# Patient Record
Sex: Male | Born: 1950 | Race: White | Hispanic: No | Smoking: Former smoker
Health system: Southern US, Community
[De-identification: ages and names within clinical notes are randomized; demographics above are authoritative.]

## PROBLEM LIST (undated history)

## (undated) DIAGNOSIS — E119 Type 2 diabetes mellitus without complications: Secondary | ICD-10-CM

## (undated) DIAGNOSIS — I2510 Atherosclerotic heart disease of native coronary artery without angina pectoris: Secondary | ICD-10-CM

## (undated) DIAGNOSIS — K449 Diaphragmatic hernia without obstruction or gangrene: Secondary | ICD-10-CM

## (undated) DIAGNOSIS — K5090 Crohn's disease, unspecified, without complications: Secondary | ICD-10-CM

## (undated) DIAGNOSIS — K219 Gastro-esophageal reflux disease without esophagitis: Secondary | ICD-10-CM

## (undated) DIAGNOSIS — I10 Essential (primary) hypertension: Secondary | ICD-10-CM

## (undated) HISTORY — PX: OTHER SURGICAL HISTORY: SHX169

## (undated) HISTORY — DX: Type 2 diabetes mellitus without complications: E11.9

## (undated) HISTORY — DX: Essential (primary) hypertension: I10

## (undated) HISTORY — DX: Diaphragmatic hernia without obstruction or gangrene: K44.9

## (undated) HISTORY — DX: Gastro-esophageal reflux disease without esophagitis: K21.9

## (undated) HISTORY — PX: CHOLECYSTECTOMY: SHX55

## (undated) HISTORY — DX: Crohn's disease, unspecified, without complications: K50.90

## (undated) HISTORY — DX: Atherosclerotic heart disease of native coronary artery without angina pectoris: I25.10

## (undated) HISTORY — PX: CARPAL TUNNEL RELEASE: SHX101

---

## 1997-11-24 ENCOUNTER — Ambulatory Visit (HOSPITAL_COMMUNITY): Admission: RE | Admit: 1997-11-24 | Discharge: 1997-11-24 | Payer: Self-pay | Admitting: Gastroenterology

## 1997-12-12 ENCOUNTER — Encounter: Admission: RE | Admit: 1997-12-12 | Discharge: 1998-03-12 | Payer: Self-pay | Admitting: Family Medicine

## 1998-01-02 ENCOUNTER — Ambulatory Visit (HOSPITAL_COMMUNITY): Admission: RE | Admit: 1998-01-02 | Discharge: 1998-01-02 | Payer: Self-pay | Admitting: Gastroenterology

## 1999-04-25 ENCOUNTER — Ambulatory Visit (HOSPITAL_COMMUNITY): Admission: RE | Admit: 1999-04-25 | Discharge: 1999-04-25 | Payer: Self-pay | Admitting: Gastroenterology

## 1999-04-25 ENCOUNTER — Encounter (INDEPENDENT_AMBULATORY_CARE_PROVIDER_SITE_OTHER): Payer: Self-pay | Admitting: Specialist

## 1999-05-25 ENCOUNTER — Ambulatory Visit (HOSPITAL_BASED_OUTPATIENT_CLINIC_OR_DEPARTMENT_OTHER): Admission: RE | Admit: 1999-05-25 | Discharge: 1999-05-25 | Payer: Self-pay | Admitting: General Surgery

## 1999-06-13 ENCOUNTER — Encounter: Admission: RE | Admit: 1999-06-13 | Discharge: 1999-06-13 | Payer: Self-pay | Admitting: Gastroenterology

## 1999-06-13 ENCOUNTER — Encounter: Payer: Self-pay | Admitting: Gastroenterology

## 2002-05-11 ENCOUNTER — Ambulatory Visit (HOSPITAL_COMMUNITY): Admission: RE | Admit: 2002-05-11 | Discharge: 2002-05-11 | Payer: Self-pay | Admitting: Gastroenterology

## 2002-05-11 ENCOUNTER — Encounter (INDEPENDENT_AMBULATORY_CARE_PROVIDER_SITE_OTHER): Payer: Self-pay | Admitting: Specialist

## 2004-05-30 ENCOUNTER — Encounter: Admission: RE | Admit: 2004-05-30 | Discharge: 2004-05-30 | Payer: Self-pay | Admitting: General Surgery

## 2004-06-29 ENCOUNTER — Encounter: Admission: RE | Admit: 2004-06-29 | Discharge: 2004-06-29 | Payer: Self-pay | Admitting: Gastroenterology

## 2004-07-24 ENCOUNTER — Ambulatory Visit (HOSPITAL_COMMUNITY): Admission: RE | Admit: 2004-07-24 | Discharge: 2004-07-24 | Payer: Self-pay | Admitting: Gastroenterology

## 2004-08-23 ENCOUNTER — Ambulatory Visit (HOSPITAL_COMMUNITY): Admission: RE | Admit: 2004-08-23 | Discharge: 2004-08-23 | Payer: Self-pay | Admitting: Gastroenterology

## 2004-08-23 ENCOUNTER — Encounter (INDEPENDENT_AMBULATORY_CARE_PROVIDER_SITE_OTHER): Payer: Self-pay | Admitting: Specialist

## 2007-10-09 ENCOUNTER — Encounter: Admission: RE | Admit: 2007-10-09 | Discharge: 2007-10-09 | Payer: Self-pay | Admitting: Gastroenterology

## 2008-01-19 ENCOUNTER — Encounter (INDEPENDENT_AMBULATORY_CARE_PROVIDER_SITE_OTHER): Payer: Self-pay | Admitting: Gastroenterology

## 2008-01-19 ENCOUNTER — Ambulatory Visit (HOSPITAL_COMMUNITY): Admission: RE | Admit: 2008-01-19 | Discharge: 2008-01-19 | Payer: Self-pay | Admitting: Gastroenterology

## 2008-01-28 ENCOUNTER — Ambulatory Visit: Payer: Self-pay | Admitting: Cardiovascular Disease

## 2008-02-05 ENCOUNTER — Ambulatory Visit: Payer: Self-pay | Admitting: Cardiovascular Disease

## 2008-02-05 ENCOUNTER — Inpatient Hospital Stay (HOSPITAL_COMMUNITY): Admission: RE | Admit: 2008-02-05 | Discharge: 2008-02-06 | Payer: Self-pay | Admitting: Cardiovascular Disease

## 2008-02-05 ENCOUNTER — Ambulatory Visit: Payer: Self-pay

## 2008-02-05 ENCOUNTER — Encounter: Payer: Self-pay | Admitting: Cardiovascular Disease

## 2008-03-08 ENCOUNTER — Ambulatory Visit: Payer: Self-pay | Admitting: Cardiovascular Disease

## 2008-03-11 ENCOUNTER — Ambulatory Visit (HOSPITAL_BASED_OUTPATIENT_CLINIC_OR_DEPARTMENT_OTHER): Admission: RE | Admit: 2008-03-11 | Discharge: 2008-03-11 | Payer: Self-pay | Admitting: Orthopedic Surgery

## 2008-03-23 ENCOUNTER — Ambulatory Visit: Payer: Self-pay

## 2008-03-23 ENCOUNTER — Encounter: Payer: Self-pay | Admitting: Cardiovascular Disease

## 2008-06-09 ENCOUNTER — Ambulatory Visit: Payer: Self-pay | Admitting: Cardiology

## 2008-06-09 ENCOUNTER — Encounter: Payer: Self-pay | Admitting: Cardiology

## 2008-08-22 ENCOUNTER — Ambulatory Visit: Payer: Self-pay | Admitting: Cardiovascular Disease

## 2008-08-22 ENCOUNTER — Encounter: Payer: Self-pay | Admitting: Cardiovascular Disease

## 2008-08-22 DIAGNOSIS — I25110 Atherosclerotic heart disease of native coronary artery with unstable angina pectoris: Secondary | ICD-10-CM | POA: Insufficient documentation

## 2008-08-22 DIAGNOSIS — I10 Essential (primary) hypertension: Secondary | ICD-10-CM | POA: Insufficient documentation

## 2008-08-22 DIAGNOSIS — E785 Hyperlipidemia, unspecified: Secondary | ICD-10-CM | POA: Insufficient documentation

## 2008-08-22 DIAGNOSIS — I2510 Atherosclerotic heart disease of native coronary artery without angina pectoris: Secondary | ICD-10-CM | POA: Insufficient documentation

## 2008-08-22 DIAGNOSIS — E1169 Type 2 diabetes mellitus with other specified complication: Secondary | ICD-10-CM | POA: Insufficient documentation

## 2009-03-02 ENCOUNTER — Ambulatory Visit: Payer: Self-pay | Admitting: Cardiovascular Disease

## 2009-03-09 ENCOUNTER — Telehealth: Payer: Self-pay | Admitting: Cardiovascular Disease

## 2009-05-25 ENCOUNTER — Ambulatory Visit: Payer: Self-pay | Admitting: Cardiovascular Disease

## 2009-06-01 ENCOUNTER — Encounter: Payer: Self-pay | Admitting: Cardiovascular Disease

## 2009-07-25 ENCOUNTER — Encounter: Payer: Self-pay | Admitting: Cardiovascular Disease

## 2009-07-25 ENCOUNTER — Ambulatory Visit: Payer: Self-pay

## 2009-07-25 ENCOUNTER — Ambulatory Visit (HOSPITAL_COMMUNITY): Admission: RE | Admit: 2009-07-25 | Discharge: 2009-07-25 | Payer: Self-pay | Admitting: Cardiovascular Disease

## 2009-07-25 ENCOUNTER — Ambulatory Visit: Payer: Self-pay | Admitting: Cardiovascular Disease

## 2009-07-31 ENCOUNTER — Ambulatory Visit: Payer: Self-pay | Admitting: Cardiovascular Disease

## 2009-07-31 ENCOUNTER — Telehealth: Payer: Self-pay | Admitting: Cardiovascular Disease

## 2009-08-01 ENCOUNTER — Ambulatory Visit: Payer: Self-pay | Admitting: Cardiovascular Disease

## 2009-08-03 ENCOUNTER — Ambulatory Visit: Payer: Self-pay | Admitting: Cardiovascular Disease

## 2009-08-03 ENCOUNTER — Inpatient Hospital Stay (HOSPITAL_COMMUNITY): Admission: RE | Admit: 2009-08-03 | Discharge: 2009-08-04 | Payer: Self-pay | Admitting: Cardiovascular Disease

## 2009-08-03 LAB — CONVERTED CEMR LAB
BUN: 10 mg/dL (ref 6–23)
Basophils Relative: 0.7 % (ref 0.0–3.0)
Chloride: 109 meq/L (ref 96–112)
Eosinophils Relative: 3.5 % (ref 0.0–5.0)
GFR calc non Af Amer: 91.24 mL/min (ref >60)
HCT: 45.9 % (ref 39.0–52.0)
Hemoglobin: 15 g/dL (ref 13.0–17.0)
Lymphs Abs: 2.1 10*3/uL (ref 0.7–4.0)
MCV: 90 fL (ref 78.0–100.0)
Monocytes Absolute: 0.5 10*3/uL (ref 0.1–1.0)
Monocytes Relative: 8.6 % (ref 3.0–12.0)
Neutro Abs: 3.3 10*3/uL (ref 1.4–7.7)
Potassium: 4.6 meq/L (ref 3.5–5.1)
RBC: 5.1 M/uL (ref 4.22–5.81)
Sodium: 144 meq/L (ref 135–145)
WBC: 6.1 10*3/uL (ref 4.5–10.5)
aPTT: 26.1 s (ref 21.7–28.8)

## 2009-08-09 ENCOUNTER — Telehealth: Payer: Self-pay | Admitting: Cardiovascular Disease

## 2009-08-14 ENCOUNTER — Emergency Department (HOSPITAL_COMMUNITY): Admission: EM | Admit: 2009-08-14 | Discharge: 2009-08-14 | Payer: Self-pay | Admitting: Emergency Medicine

## 2009-09-11 ENCOUNTER — Ambulatory Visit: Payer: Self-pay | Admitting: Cardiovascular Disease

## 2009-09-11 DIAGNOSIS — R5381 Other malaise: Secondary | ICD-10-CM | POA: Insufficient documentation

## 2009-09-11 DIAGNOSIS — R5383 Other fatigue: Secondary | ICD-10-CM

## 2009-09-12 LAB — CONVERTED CEMR LAB: TSH: 0.89 microintl units/mL (ref 0.35–5.50)

## 2009-11-23 ENCOUNTER — Ambulatory Visit: Payer: Self-pay | Admitting: Cardiovascular Disease

## 2010-01-03 ENCOUNTER — Telehealth: Payer: Self-pay | Admitting: Cardiovascular Disease

## 2010-05-29 ENCOUNTER — Ambulatory Visit: Payer: Self-pay | Admitting: Cardiovascular Disease

## 2010-07-14 ENCOUNTER — Encounter: Payer: Self-pay | Admitting: Gastroenterology

## 2010-07-24 NOTE — Progress Notes (Signed)
Summary: cath  Phone Note Call from Patient Call back at Home Phone [phone]   Caller: Patient Reason for Call: Talk to Nurse Action Taken: Appt Scheduled Summary of Call: calling back to set up cath Initial call taken by: Migdalia Dk,  July 31, 2009 2:39 PM  Follow-up for Phone Call        Spoke with pt. He would like to have catheterization done AM 2/10. Dossie Arbour, RN, BSN  July 31, 2009 2:52 PM Cath set up for 7:30 AM on 08/03/09 with pt to arrive at 6 AM at short stay.  Pt in agreement with these times.  He will come to office on 08/01/09 for pre cath labs and will pick up cath instructions at that time.  I verbally gave pre cath instructions to pt who verbalized understanding. Follow-up by: Dossie Arbour, RN, BSN,  July 31, 2009 3:11 PM   New Allergies: ! GLUCOPHAGE ! DOXYCYCLINE ! * DICYCLOMINE ! * CHLORTHALIDONE New Allergies: ! GLUCOPHAGE ! DOXYCYCLINE ! * DICYCLOMINE ! * CHLORTHALIDONE

## 2010-07-24 NOTE — Assessment & Plan Note (Signed)
Summary: John Cuevas   Visit Type:  6 mo f/u Primary Provider:  Herb Grays  CC:  pt states our scale weighed him wrong says he only 205 and though he does have on heavy boots today...denies any complaints today.  History of Present Illness: 60 yo WM with h/o HTN, Hyperlipidemia, DM and CAD diagnosed by cath August 2009 here for follow up. Pt had DES placed in proximal RCA August 2009 and  drug eluting stents in the LAD and the OM in February 2011. He returns today for follow up. He has been doing well. His only complaint is easy bruising on the Effient. No chest pain or SOB. He has been active. Muscle aches better off of statin.     Current Medications (verified): 1)  Toprol Xl 50 Mg Xr24h-Tab (Metoprolol Succinate) .... One By Mouth Per Day 2)  Aspirin 81 Mg Tbec (Aspirin) .... Take One Tablet By Mouth Daily 3)  Nexium 40 Mg Cpdr (Esomeprazole Magnesium) .... Daily 4)  Asacol 400 Mg Tbec (Mesalamine) .... Pt Varies Dose Per Daily 5)  Nitroglycerin 0.4 Mg Subl (Nitroglycerin) .... One Tablet Under Tongue Every 5 Minutes As Needed For Chest Pain---May Repeat Times Three 6)  Effient 10 Mg Tabs (Prasugrel Hcl) .Marland Kitchen.. 1 Tab Once Daily 7)  Vsl#3  Caps (Probiotic Product) .... As Directed 8)  Xyzal 5 Mg Tabs (Levocetirizine Dihydrochloride) .... Take 1 By Mouth At Bedtime 9)  Toprol Xl 25 Mg Xr24h-Tab (Metoprolol Succinate) .... Take One Tablet By Mouth  As Needed At Bedtime For Elevated Blood Pressure  Allergies: 1)  ! Plavix (Clopidogrel Bisulfate) 2)  ! Niacin 3)  ! Glucophage 4)  ! Doxycycline 5)  ! * Dicyclomine 6)  ! * Chlorthalidone  Past History:  Past Medical History: Reviewed history from 09/11/2009 and no changes required. Diabetes Type 2 Hypertension CAD s/p drug eluting stent RCA 8/09, drug eluting stent LAD/OM 2/11.  Hiatal hernia Crohns disease GERD  Social History: Reviewed history from 05/25/2009 and no changes required. 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    Vital Signs:  Patient profile:   61 year old male Height:      67 inches Weight:      214.25 pounds BMI:     33.68 Pulse rate:   78 / minute Pulse rhythm:   regular BP sitting:   130 / 90  (left arm) Cuff size:   large  Vitals Entered By: Danielle Rankin, CMA (May 29, 2010 9:17 AM)  Physical Exam  General:  General: Well developed, well nourished, NAD Musculoskeletal: Muscle strength 5/5 all ext Psychiatric: Mood and affect normal Neck: No JVD, no carotid bruits, no thyromegaly, no lymphadenopathy. Lungs:Clear bilaterally, no wheezes, rhonci, crackles CV: RRR no murmurs, gallops rubs Abdomen: soft, NT, ND, BS present Extremities: No edema, pulses 2+.    Impression & Recommendations:  Problem # 1:  CAD, NATIVE VESSEL (ICD-414.01)  Stable. Will need to stay on ASA and Effient until mid February. He will stop his  Effient in February. He is ready to stop the Effient because of bruising. Continue Toprol. He is not on a statin because of statin induced myalgias. He does not wish to restart a statin. His cholesterol is followed in primary care.   His updated medication list for this problem includes:    Toprol Xl 50 Mg Xr24h-tab (Metoprolol succinate) ..... One by mouth per day    Aspirin 81 Mg Tbec (Aspirin) .Marland Kitchen... Take one tablet by mouth daily    Nitroglycerin 0.4 Mg Subl  (Nitroglycerin) ..... One tablet under tongue every 5 minutes as needed for chest pain---may repeat times three    Effient 10 Mg Tabs (Prasugrel hcl) .Marland Kitchen... 1 tab once daily    Toprol Xl 25 Mg Xr24h-tab (Metoprolol succinate) .Marland Kitchen... Take one tablet by mouth  as needed at bedtime for elevated blood pressure  Orders: EKG w/ Interpretation (93000)  Problem # 2:  HYPERTENSION, BENIGN (ICD-401.1) Stable. No changes.   His updated medication list for this problem includes:    Toprol Xl 50 Mg Xr24h-tab (Metoprolol succinate) ..... One by mouth per day    Aspirin 81 Mg Tbec (Aspirin) .Marland Kitchen... Take one tablet by mouth daily    Toprol Xl 25 Mg Xr24h-tab (Metoprolol succinate) .Marland Kitchen... Take one tablet by mouth  as needed at bedtime for elevated blood pressure  Patient Instructions: 1)  Your physician recommends that you schedule a follow-up appointment in: 6 months. 2)  Your physician recommends that you continue on your current medications as directed. Please refer to the Current Medication list given to you today.

## 2010-07-24 NOTE — Letter (Signed)
Summary: Leaving Cardiology Clinic Against Medical Advice Form  Leaving Cardiology Clinic Against Medical Advice Form   Imported By: Roderic Ovens 07/04/2009 11:18:39  _____________________________________________________________________  External Attachment:    Type:   Image     Comment:   External Document

## 2010-07-24 NOTE — Cardiovascular Report (Signed)
Summary: Pre Cath Orders  Pre Cath Orders   Imported By: Roderic Ovens 08/04/2009 15:27:24  _____________________________________________________________________  External Attachment:    Type:   Image     Comment:   External Document

## 2010-07-24 NOTE — Assessment & Plan Note (Signed)
Summary: f/u abnormal stress echo/lg   Visit Type:  abnormal stress test Primary Provider:  Herb Grays  CC:  pt states chest gets tight when his hernia acts up....  History of Present Illness: 60 yo WM with h/o HTN, Hyperlipidemia, DM and CAD diagnosed by cath August 2009 here for follow up. Pt had DES placed in proximal RCA August 2009. He had done well from a cardiac standpoint for a year but at last visit, he c/o substernal chest burning. We ordered a stress echo which showed septal and inferoapical ischemia. He returns today for follow up. His rash has been treated and is better. His back pain and leg pain is unchanged. His diarrhea is improved. He has been feeling tired lately. He continues to have some substernal burning with exertion.     Current Medications (verified): 1)  Toprol Xl 50 Mg Xr24h-Tab (Metoprolol Succinate) .... One By Mouth Per Day 2)  Aspirin 81 Mg Tbec (Aspirin) .... Take One Tablet By Mouth Daily 3)  Nexium 40 Mg Cpdr (Esomeprazole Magnesium) .... Daily 4)  Xyzal 5 Mg Tabs (Levocetirizine Dihydrochloride) .... As Needed 5)  Asacol 400 Mg Tbec (Mesalamine) .... Three Tabs By Mouth Twice Daily 6)  Nitroglycerin 0.4 Mg Subl (Nitroglycerin) .... One Tablet Under Tongue Every 5 Minutes As Needed For Chest Pain---May Repeat Times Three 7)  Lipitor 40 Mg Tabs (Atorvastatin Calcium) .Marland Kitchen.. 1 Tab At Bedtime  Allergies: 1)  ! Plavix (Clopidogrel Bisulfate) 2)  ! Niacin  Past History:  Past Medical History: Reviewed history from 03/02/2009 and no changes required. Diabetes Type 2 Hypertension CAD s/p drug eluting stent RCA 8/09 Hiatal hernia Crohns disease GERD  Past Surgical History: Reviewed history from 08/22/2008 and no changes required. Cholecystectomy Laporoscopic knee surgery Carpal tunnel release  Family History: Reviewed history from 08/22/2008 and no changes required. Mother-alive, healthy Father-alive, healthy No premature CAD or sudden cardiac  death  Social History: Reviewed history from 05/25/2009 and no changes required. 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  The patient denies malaise, fever, weight gain/loss, vision loss, decreased hearing, hoarseness, palpitations, shortness of breath, prolonged cough, wheezing, sleep apnea, coughing up blood, abdominal pain, blood in stool, nausea, vomiting, diarrhea, heartburn, incontinence, blood in urine, muscle weakness, joint pain, leg swelling, rash, skin lesions, headache, fainting, dizziness, depression, anxiety, enlarged lymph nodes, easy bruising or bleeding, and environmental allergies.    Vital Signs:  Patient profile:   60 year old male Height:      67 inches Weight:      212 pounds BMI:     33.32 Pulse rate:   71 / minute Pulse rhythm:   regular BP sitting:   147 / 91  (left arm) Cuff size:   large  Vitals Entered By: Danielle Rankin, CMA (July 31, 2009 11:30 AM)  Physical Exam  General:  General: Well developed, well nourished, NAD HEENT: OP clear, mucus membranes moist SKIN: warm, dry Neuro: No focal deficits Musculoskeletal: Muscle strength 5/5 all ext Psychiatric: Mood and affect normal Neck: No JVD, no carotid bruits, no thyromegaly, no lymphadenopathy. Lungs:Clear bilaterally, no wheezes, rhonci, crackles CV: RRR no murmurs, gallops rubs Abdomen: soft, NT, ND, BS present Extremities: No edema, pulses 2+.    Stress Echocardiogram  Procedure date:  07/25/2009  Findings:      -  Stress: With stress there was 1.20mm ST segment depression in the   lateral leads that persisted into recovery. This was associated   with chest pain - Staged echo: At rest the septum and apical inferior wall appeared   hypokinetic. With stress there was worsening of the septal   hypokinesis with recovery of  the inferoapical hypokinisis. Chest   pain, ECG changes and RWMA's all consistant with significant CAD  Impression & Recommendations:  Problem # 1:  CAD, NATIVE VESSEL (ICD-414.01) Recent return of chest pain with exertion. Stress echo abnormal. Will plan left heart cath in MAIN cath lab as pt likely to need PCI. I have given him a  list of dates that I am in the cath lab. He will call  after he finds a day that his brother can come down and drive him in. Plan BMET, CBC and coags before cath.   His updated medication list for this problem includes:    Toprol Xl 50 Mg Xr24h-tab (Metoprolol succinate) ..... One by mouth per day    Aspirin 81 Mg Tbec (Aspirin) .Marland Kitchen... Take one tablet by mouth daily    Nitroglycerin 0.4 Mg Subl (Nitroglycerin) ..... One tablet under tongue every 5 minutes as needed for chest pain---may repeat times three  Problem # 2:  HYPERTENSION, BENIGN (ICD-401.1) Continue current meds. If elevated at next vist, will adjust meds.   His updated medication list for this problem includes:    Toprol Xl 50 Mg Xr24h-tab (Metoprolol succinate) ..... One by mouth per day    Aspirin 81 Mg Tbec (Aspirin) .Marland Kitchen... Take one tablet by mouth daily  Patient Instructions: 1)  Your physician recommends that you schedule a follow-up appointment in: 4-6 weeks. 2)  Please call us when you are ready to schedule catheterization. You will need lab work done in office a few days prior to catheterization. Prescriptions: LIPITOR 40 MG TABS (ATORVASTATIN CALCIUM) 1 tab at bedtime  #90 x 3   Entered by:   Danielle Rankin, CMA   Authorized by:   Verne Carrow, MD   Signed by:   Danielle Rankin, CMA on 07/31/2009   Method used:   Electronically to        [facility]* (mail-order)             ,          Ph: [phone]       Fax: [phone]   RxID:   [id number]

## 2010-07-24 NOTE — Progress Notes (Signed)
Summary: QUESTION ABOUT A MEDICATION  Phone Note Call from Patient Call back at Home Phone [phone]   Caller: Patient Summary of Call: PT CALLING REGARDING  BLOOD THINNER,HAVE QUESTIONS ABOUT THE MEDICATION Initial call taken by: Judie Grieve,  August 09, 2009 3:14 PM  Follow-up for Phone Call        Spoke with pt. Patient is  post  drug eluting stent placement done 08/03/09 Pt. states The Prasugrel 10 mg by mouth daily prescrived on D/C from Hospital. Pt. states this medication is making to have upset stomach, fever blister on lips also is making him feel tired. Pt. states last night called talked with a PA in our service pt. to stop medication. She will call him back today. Dr. Clifton James notified. Recommended to call pt. and tell him to continue medication. MD will call him back later. Pt. was called to let him know. Pt. States" I will take the medication , but it will need to be change to something else later". Ollen Gross, RN, BSN  August 09, 2009 3:48 PM    Additional Follow-up for Phone Call Additional follow up Details #1::        I spoke to Mr. Kruk. He has been taking his Effient but over the last few days describes malaise, fever blisters and GI upset. This sounds like the viral illness that has been in our community. I have asked him to keep taking all medications and call us in two days if symtpoms persist. We could start Plavix if he has continued problems. I told him that he absolutely needs dual antiplatelet therapy for at least one year.  Additional Follow-up by: Verne Carrow, MD,  August 09, 2009 4:08 PM

## 2010-07-24 NOTE — Assessment & Plan Note (Signed)
Summary: 6 month follow up/414.01/pla   Visit Type:  Follow-up Primary Provider:  Herb Grays  CC:  3 month ROV; C/O bilateral arm & leg pain since being on Effient- wants to change RX; (Stopped Lipitor b/c he doesn't have ^ lipids & d/t stomach irritation).  History of Present Illness: 60 yo WM with h/o HTN, Hyperlipidemia, DM and CAD diagnosed by cath August 2009 here for follow up. Pt had DES placed in proximal RCA August 2009. He had done well from a cardiac standpoint for a year but at last visit, we arranged a cath based on his  substernal chest burning and a stress echo which showed septal and inferoapical ischemia. A drug eluting stent was placed in the LAD and the OM. He returns today for follow up. He has been doing well. He has had no chest pain since his PCI. He continues to have occasional fatigue. He always blames his anti-platelet therapy for his fatigue. He had issues with Plavix in the past so we have been using Effient since his most recent stent placement. He has been taking all of his medications. He has a multitude of complaints today. He is having back pain, bilateral arm pain, leg pain, face pain, face numbness, upset stomach. He does have recurrence of lesions on his arms and legs which he had in the past. He feels that bugs are crawling on his ears and face. He stopped taking his Lipitor because it upset his stomach. He is convinced that the cardiac meds are causing his GI issues and his rash. He is going to see a Dermatologist tomorrow to evaluate his skin lesions.   He fortunately does not have chest pain. He has no SOB. He tells me that he feels great overall and can run a mile if he wants to. It has been difficult to convince him to take medications in the past. He often feels that any new medications cause multi system problems.    Current Medications (verified): 1)  Toprol Xl 50 Mg Xr24h-Tab (Metoprolol Succinate) .... One By Mouth Per Day, Take 1 in The Evening As  Needed 2)  Aspirin Ec 325 Mg Tbec (Aspirin) .... Take One Tablet By Mouth Daily 3)  Nexium 40 Mg Cpdr (Esomeprazole Magnesium) .... Daily 4)  Asacol 400 Mg Tbec (Mesalamine) .... Three Tabs By Mouth Twice Daily 5)  Nitroglycerin 0.4 Mg Subl (Nitroglycerin) .... One Tablet Under Tongue Every 5 Minutes As Needed For Chest Pain---May Repeat Times Three 6)  Effient 10 Mg Tabs (Prasugrel Hcl) .Marland Kitchen.. 1 Tab Once Daily 7)  Vsl#3  Caps (Probiotic Product) .... As Directed 8)  Xyzal 5 Mg Tabs (Levocetirizine Dihydrochloride) .... Take 1 By Mouth At Bedtime  Allergies: 1)  ! Plavix (Clopidogrel Bisulfate) 2)  ! Niacin 3)  ! Glucophage 4)  ! Doxycycline 5)  ! * Dicyclomine 6)  ! * Chlorthalidone  Past History:  Past Medical History: Reviewed history from 09/11/2009 and no changes required. Diabetes Type 2 Hypertension CAD s/p drug eluting stent RCA 8/09, drug eluting stent LAD/OM 2/11.  Hiatal hernia Crohns disease GERD  Social History: Reviewed history from 05/25/2009 and no changes required. 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  The patient denies fatigue, malaise, fever, weight gain/loss, vision loss,  decreased hearing, hoarseness, chest pain, palpitations, shortness of breath, prolonged cough, wheezing, sleep apnea, coughing up blood, abdominal pain, blood in stool, nausea, vomiting, diarrhea, incontinence, blood in urine, leg swelling, rash, headache, fainting, dizziness, depression, anxiety, enlarged lymph nodes, and environmental allergies.         See HPI  Vital Signs:  Patient profile:   60 year old male Height:      67 inches Weight:      211 pounds BMI:     33.17 Pulse rate:   76 / minute BP sitting:   140 / 78  (left arm) Cuff size:    regular  Vitals Entered By: Stanton Kidney, EMT-P (November 23, 2009 3:30 PM)  Physical Exam  General:  General: Well developed, well nourished, NAD HEENT: OP clear, mucus membranes moist SKIN: non blanching, circular lesions 0.5 to 1.0 cm over arms and legs. No petechial rash.  Neuro: No focal deficits Musculoskeletal: Muscle strength 5/5 all ext Psychiatric: Mood and affect normal Lungs:Clear bilaterally, no wheezes, rhonci, crackles CV: RRR no murmurs, gallops rubs Abdomen: soft, NT, ND, BS present Extremities: No edema, pulses 2+.    EKG  Procedure date:  11/23/2009  Findings:      NSR, rate 76 bpm. Normal EKG  Impression & Recommendations:  Problem # 1:  CAD, NATIVE VESSEL (ICD-414.01) Stable. He refuses to take Lipitor at this time. I am not certain that any of his muscle aches are statin related as he has been off of this medication for 2.5 months but continues to have muscles aches in arms and legs. I would like to encourage him to take a statin in the future for the beneficial cardiac effects, regardless of his cholesterol levels. I would also like to continue dual antiplatelet therapy with ASA and Effient for a total of one year (at least until February 2012). I will reduce his dose of ASA to 81 mg (enteric coated) as he continues to have GI upset. He is having fasting labs in the am at Dr. Alda Berthold office. I will ask them to add a CK level to the blood draw.  Continue beta blocker. He is asking for a separate 25 mg prescription to take at night as needed for SBP over 140. We will try to arrange this. I have spent over 30 miniutes today in medication counseling. He is very difficult to convince of the importance of medications.    His updated medication list for this problem includes:    Toprol Xl 50 Mg Xr24h-tab (Metoprolol succinate) ..... One by mouth per day    Aspirin 81 Mg Tbec (Aspirin) .Marland Kitchen... Take one tablet by mouth daily    Nitroglycerin 0.4 Mg Subl (Nitroglycerin) .....  One tablet under tongue every 5 minutes as needed for chest pain---may repeat times three    Effient 10 Mg Tabs (Prasugrel hcl) .Marland Kitchen... 1 tab once daily    Toprol Xl 25 Mg Xr24h-tab (Metoprolol succinate) .Marland Kitchen... Take one tablet by mouth  as needed at bedtime for elevated blood pressure  Orders: EKG w/ Interpretation (93000)  Problem # 2:  HYPERLIPIDEMIA-MIXED (ICD-272.4) See above. Fasting labs in am in primary care.   The following medications were removed from the medication list:    Lipitor 40 Mg Tabs (Atorvastatin calcium) .Marland Kitchen... 1 tab at bedtime  Problem # 3:  HYPERTENSION, BENIGN (ICD-401.1) Well controlled on current therapy. No changes.   His updated medication list for this problem includes:    Toprol Xl 50 Mg  Xr24h-tab (Metoprolol succinate) ..... One by mouth per day    Aspirin 81 Mg Tbec (Aspirin) .Marland Kitchen... Take one tablet by mouth daily    Toprol Xl 25 Mg Xr24h-tab (Metoprolol succinate) .Marland Kitchen... Take one tablet by mouth  as needed at bedtime for elevated blood pressure  Patient Instructions: 1)  Your physician recommends that you schedule a follow-up appointment in: 6 months 2)  Your physician has recommended you make the following change in your medication: Decrease enteric coated aspirin to 81 mg by mouth daily. 3)  Take Toprol XL 25 mg by mouth as needed at bedtime for elevated blood pressure. 4)  Take prescription to primary MD for lab work. Prescriptions: TOPROL XL 25 MG XR24H-TAB (METOPROLOL SUCCINATE) take one tablet by mouth  as needed at bedtime for elevated blood pressure  #30 x 6   Entered by:   Dossie Arbour, RN, BSN   Authorized by:   Verne Carrow, MD   Signed by:   Dossie Arbour, RN, BSN on 11/23/2009   Method used:   Electronically to        [facility] [address] (retail)       [address]       [place], [place]  [address]       Ph: [phone] or [phone]       Fax: [phone]   RxID:   [id number]

## 2010-07-24 NOTE — Progress Notes (Signed)
Summary: [facility] needs call re pt's med  Phone Note From Other Clinic   Summary of Call: [facility] needs a call [phone]  ref #[phone] re pt's metroprolol 50 mg Initial call taken by: Glynda Jaeger,  January 03, 2010 4:14 PM  Follow-up for Phone Call        Clarified metroprolol orders with [facility] as on med list. Follow-up by: Dossie Arbour, RN, BSN,  January 03, 2010 4:53 PM

## 2010-07-24 NOTE — Assessment & Plan Note (Signed)
Summary: 6wk f/u sl   Visit Type:  Follow-up Primary Provider:  Herb Grays  CC:  pt states he has had a couple of headaches....no cp or edema...does c/o fatigue at times.  History of Present Illness: 60 yo WM with h/o HTN, Hyperlipidemia, DM and CAD diagnosed by cath August 2009 here for follow up. Pt had DES placed in proximal RCA August 2009. He had done well from a cardiac standpoint for a year but at last visit, we arranged a cath based on his  substernal chest burning and a stress echo which showed septal and inferoapical ischemia. A drug eluting stent was placed in the LAD and the OM. He returns today for follow up. He has been doing well. He has had no chest pain since his PCI. He continues to have occasional fatigue. He always blames his anti-platelet therapy for his fatigue. He had issues with Plavix in the past so we have been using Effient since his most recent stent placement. He has been taking all of his medications. He worked in his yard all weekend without any problems.    Current Medications (verified): 1)  Toprol Xl 50 Mg Xr24h-Tab (Metoprolol Succinate) .... One By Mouth Per Day 2)  Aspirin Ec 325 Mg Tbec (Aspirin) .... Take One Tablet By Mouth Daily 3)  Nexium 40 Mg Cpdr (Esomeprazole Magnesium) .... Daily 4)  Asacol 400 Mg Tbec (Mesalamine) .... Three Tabs By Mouth Twice Daily 5)  Nitroglycerin 0.4 Mg Subl (Nitroglycerin) .... One Tablet Under Tongue Every 5 Minutes As Needed For Chest Pain---May Repeat Times Three 6)  Lipitor 40 Mg Tabs (Atorvastatin Calcium) .Marland Kitchen.. 1 Tab At Bedtime 7)  Effient 10 Mg Tabs (Prasugrel Hcl) .Marland Kitchen.. 1 Tab Once Daily  Allergies: 1)  ! Plavix (Clopidogrel Bisulfate) 2)  ! Niacin 3)  ! Glucophage 4)  ! Doxycycline 5)  ! * Dicyclomine 6)  ! * Chlorthalidone  Past History:  Past Medical History: Diabetes Type 2 Hypertension CAD s/p drug eluting stent RCA 8/09, drug eluting stent LAD/OM 2/11.  Hiatal hernia Crohns disease GERD  Social  History: Reviewed history from 05/25/2009 and no changes required. Divorced, 2 children [employer]y [employer] Psychologist, forensic crew-retired Smoked 1-2ppd for 20 years but stopped smoking in 1999 No alcohol or drugs  Review of Systems       The patient complains of fatigue.  The patient denies malaise, fever, weight gain/loss, vision loss, decreased hearing, hoarseness, chest pain, palpitations, shortness of breath, prolonged cough, wheezing, sleep apnea, coughing up blood, abdominal pain, blood in stool, nausea, vomiting, diarrhea, heartburn, incontinence, blood in urine, muscle weakness, joint pain, leg swelling, rash, skin lesions, headache, fainting, dizziness, depression, anxiety, enlarged lymph nodes, easy bruising or bleeding, and environmental allergies.    Vital Signs:  Patient profile:   60 year old male Height:      67 inches Weight:      211 pounds BMI:     33.17 Pulse rate:   78 / minute Pulse rhythm:   regular BP sitting:   130 / 80  (left arm) Cuff size:   large  Vitals Entered By: Danielle Rankin, CMA (September 11, 2009 10:37 AM)  Physical Exam  General:  General: Well developed, well nourished, NAD Musculoskeletal: Muscle strength 5/5 all ext Psychiatric: Mood and affect normal Neck: No JVD, no carotid bruits, no thyromegaly, no lymphadenopathy. Lungs:Clear bilaterally, no wheezes, rhonci, crackles CV: RRR no murmurs, gallops rubs Abdomen: soft, NT, ND, BS present Extremities: No edema, pulses 2+.  Cardiac Cath  Procedure date:  08/03/2009  Findings:      1. The left main coronary artery had a distal 30% stenosis. 2. The left anterior descending was a large vessel that coursed to the     apex and had a 70-75% stenosis in the proximal portion.  The     midportion of the vessel had diffuse plaque.  The distal portion     had a long tubular 65% stenosis where the vessel was small in     caliber just before the apex.  First diagonal was moderate sized     and had an ostial 50%  lesion with diffuse plaque throughout the     vessel. 3. Circumflex artery gave off an early small to moderate size ramus     intermediate branch that had diffuse plaque disease.  The proximal     mid circumflex vessel had plaque disease.  The first obtuse     marginal had plaque disease and a discrete 80-90% stenosis in the     distal portion. 4. The right coronary artery was a large dominant vessel with a patent     stent in the proximal portion.  There was serial 30% lesions     throughout the mid and distal portion of the vessel.  The PDA and     posterolateral branch had plaque disease. 5. Left ventricular angiogram showed normal left ventricular systolic     function with ejection fraction of 65%.   IMPRESSION: 1. Triple-vessel coronary artery disease. 2. Normal left ventricular systolic function. 3. Successful percutaneous coronary intervention with placement of a     drug-eluting stent in the proximal left anterior descending and the     first obtuse marginal branch.  Impression & Recommendations:  Problem # 1:  CAD, NATIVE VESSEL (ICD-414.01) Stable post PCI. Continue ASA and Effient for at least one year. Continue statin and beta blocker.  No change in medications.   His updated medication list for this problem includes:    Toprol Xl 50 Mg Xr24h-tab (Metoprolol succinate) ..... One by mouth per day    Aspirin Ec 325 Mg Tbec (Aspirin) .Marland Kitchen... Take one tablet by mouth daily    Nitroglycerin 0.4 Mg Subl (Nitroglycerin) ..... One tablet under tongue every 5 minutes as needed for chest pain---may repeat times three    Effient 10 Mg Tabs (Prasugrel hcl) .Marland Kitchen... 1 tab once daily  Problem # 2:  HYPERTENSION, BENIGN (ICD-401.1) Well controlled. Continue current therapy.   His updated medication list for this problem includes:    Toprol Xl 50 Mg Xr24h-tab (Metoprolol succinate) ..... One by mouth per day    Aspirin Ec 325 Mg Tbec (Aspirin) .Marland Kitchen... Take one tablet by mouth  daily  Problem # 3:  FATIGUE (ICD-780.79) Check TSH today.   Orders: TLB-TSH (Thyroid Stimulating Hormone) [phone])  Patient Instructions: 1)  Your physician recommends that you schedule a follow-up appointment in: 6 months 2)  Your physician recommends that you continue on your current medications as directed. Please refer to the Current Medication list given to you today.

## 2010-07-24 NOTE — Letter (Signed)
Summary: Cardiac Catheterization Instructions- Main Lab  [facility], Main Office  [address]   [place], [place] [address]   Phone: [phone]  Fax: [phone]     07/31/2009 MRN: [id number]  John Cuevas [address] [place], [place]  [address]  Dear Mr. LARNIE, HEART are scheduled for Cardiac Catheterization on February 10,2011              with Dr. Clifton James           .  Please arrive at the [facility] at   6:00    a.m. on the day of your procedure.  1. DIET     __X__ Nothing to eat or drink after midnight except your medications with a sip of water.  2. Come to the [place] office on August 01, 2009            for lab work.  The lab at [facility] is open from 8:30 a.m. to 1:30 p.m. and 2:30 p.m. to 5:00 p.m.  The lab at [facility] is open from 7:30 a.m. to 5:30 p.m.  You do not have to be fasting.  3. MAKE SURE YOU TAKE YOUR ASPIRIN.  4. _____ DO NOT TAKE these medications before your procedure:         ________________________________________________________________________________      ___X_ YOU MAY TAKE ALL of your remaining medications with a small amount of water.      ____ START NEW medications:     ________________________________________________________________________________      ____ Eilene Ghazi instructions:     ________________________________________________________________________________  5. Plan for one night stay - bring personal belongings (i.e. toothpaste, toothbrush, etc.)  6. Bring a current list of your medications and current insurance cards.  7. Must have a responsible person to drive you home.   8. Someone must be with yu for the first 24 hours after you arrive home.  9. Please wear clothes that are easy to get on and off and wear slip-on shoes.  *Special note: Every effort is made to have your procedure done on time.  Occasionally there are emergencies that  present themselves at the hospital that may cause delays.  Please be patient if a delay does occur.  If you have any questions after you get home, please call the office at the number listed above.  Dossie Arbour, RN, BSN

## 2010-09-12 LAB — GLUCOSE, CAPILLARY
Glucose-Capillary: 119 mg/dL — ABNORMAL HIGH (ref 70–99)
Glucose-Capillary: 176 mg/dL — ABNORMAL HIGH (ref 70–99)

## 2010-09-12 LAB — BASIC METABOLIC PANEL
CO2: 27 mEq/L (ref 19–32)
Calcium: 8.7 mg/dL (ref 8.4–10.5)
Chloride: 102 mEq/L (ref 96–112)
Creatinine, Ser: 0.83 mg/dL (ref 0.4–1.5)
GFR calc Af Amer: >60 mL/min (ref >60)
Glucose, Bld: 159 mg/dL — ABNORMAL HIGH (ref 70–99)
Sodium: 135 mEq/L (ref 135–145)

## 2010-09-12 LAB — CBC
Hemoglobin: 14.6 g/dL (ref 13.0–17.0)
MCHC: 34.2 g/dL (ref 30.0–36.0)
MCV: 88.1 fL (ref 78.0–100.0)
RBC: 4.83 MIL/uL (ref 4.22–5.81)
RDW: 12.8 % (ref 11.5–15.5)

## 2010-09-25 ENCOUNTER — Other Ambulatory Visit: Payer: Self-pay | Admitting: Cardiovascular Disease

## 2010-11-06 NOTE — Discharge Summary (Signed)
NAME:  John Cuevas, John Cuevas             ACCOUNT NO.:  [id number]   MEDICAL RECORD NO.:  [id number]          PATIENT TYPE:  INP   LOCATION:  6532                         FACILITY:  [facility]   PHYSICIAN:  Thomas C. Wall, MD, FACCDATE OF BIRTH:  [birth year]   DATE OF ADMISSION:  02/05/2008  DATE OF DISCHARGE:  02/06/2008                               DISCHARGE SUMMARY   PRIMARY CARDIOLOGIST:  Verne Carrow, MD.   PRIMARY CARE PHYSICIAN:  Tammy R. Collins Scotland, MD.   PROCEDURES PERFORMED DURING HOSPITALIZATION:  Cardiac catheterization  dated February 05, 2008, completed by Dr. Verne Carrow revealing  double-vessel coronary artery disease, status post successful PCI of the  proximal right coronary artery with placement of Promus drug-eluting  stent, segmental wall motion abnormality of the left ventricle with  overall preserved ejection fraction, and moderate mitral regurgitation.   FINAL DISCHARGE DIAGNOSES:  1. Coronary artery disease.      a.     Status post cardiac catheterization, February 05, 2008.  2. Hypertension.  3. Type 2 diabetes.  4. Hiatal hernia.  5. Colitis.  6. Questionable Crohn disease.  7. Gastroesophageal reflux disease.   HOSPITAL COURSE:  This is a 60 year old Caucasian male with history of  GERD, hypertension, and hyperlipidemia who was seen in the office by Dr.  Tonny Bollman secondary to recurrent chest discomfort and an abnormal  stress Myoview.  The patient had been stress Myoviewed the morning of  admission and was found to be positive for inferior wall ischemia, only  achieving 70% of the maximum predicted heart rate with 10/10 chest pain  during the procedure.  Dr. Excell Seltzer was the OD that day and evaluated the  stress test and the patient and felt that the patient would benefit from  cardiac catheterization for further evaluation of recurrent chest pain  with abnormal stress Myoview.   The patient was transferred to [facility] where he was seen  and  examined by Dr. Verne Carrow and underwent a cardiac  catheterization.  Please see copy of Dr. Gibson Ramp thorough cardiac  catheterization note for more details.  The patient tolerated the  procedure well without any evidence of hematoma, bleeding, or signs of  infection.  He was very grateful post procedure for the finding of the  coronary artery disease and stent placement, stating that he was glad  that he did not have to have a heart attack in order to experience  cardiac catheterization with PCI.  The patient has had no further  complaints of chest pain or heartburn symptoms and was anxious to return  home the day of discharge.   The patient was seen and examined by Dr. Valera Castle on day of discharge  and found to be stable.  The patient was without complaint of any chest  pain.  The patient had multiple questions which were answered by myself,  Dr. Daleen Squibb, and Cardiac Rehabilitation.  The patient has been advised on  the need to take aspirin and Plavix every day.  Plavix will be taken for  1 year.  He will also be placed on Lipitor.  The  patient had been on  Toprol-XL 50 mg 1 p.o. daily and has been changed to 25 mg a day.  The  patient's blood pressure is currently controlled and is within normal  limits.   DISCHARGE LABORATORIES:  Sodium 139, potassium 3.5, chloride 105, CO2 of  26, BUN 10, creatinine 0.98, glucose 155, hemoglobin 13.7, hematocrit  40.6, white blood cells 5.7, and platelets 219.  Chest x-ray revealing  no acute findings with probable pH probe in the esophagus.  EKG  revealing normal sinus rhythm with an incomplete right bundle-branch  block and inferior ischemia noted.   DISCHARGE VITAL SIGNS:  Blood pressure 128/52, heart rate 66,  respirations 16, temperature 97.7, and O2 saturation 95% on room air.   DISCHARGE MEDICATIONS:  1. Plavix 75 mg daily (prescription provided for local and for 90-day      supply).  2. Nexium 40 mg daily.  3. Asacol  400 mg 4 times a day.  4. Avandamet 2/500 mg daily, to start tomorrow.  5. Fluconazole twice a day.  6. Aspirin 325 mg daily.  7. Nitroglycerin 0.4 mg p.r.n. chest pain.  8. Toprol-XL 25 mg daily (prescription provided for 90-day supply as      well as local).   ALLERGIES:  NIACIN.   FOLLOWUP PLANS AND APPOINTMENT:  1. As it is the weekend, our office will call him to make followup      appointment with Dr. Verne Carrow if one has not already      been made prior to this admission.  2. The patient has been given post-cardiac-catheterization recovery      instructions, with particular emphasis on the right groin site for      evidence of bleeding, hematoma, infection, or pain.  3. The patient has been given a work release, as he is not to return      to work until being seen by Dr. Clifton James in approximately 2 weeks.  4. The patient is to follow with primary care physician for continued      diabetic management.  5. The patient has been advised of new medication and medication      dosage adjustments, i.e., Toprol, for outpatient medication doses.   Time spent with the patient, to include physician time: 45 minutes.      Bettey Mare. Lyman Bishop, NP      Jesse Sans. Daleen Squibb, MD, [facility]  Electronically Signed    KML/MEDQ  D:  02/06/2008  T:  02/06/2008  Job:  [id number]   cc:   Tammy R. Collins Scotland, M.D.

## 2010-11-06 NOTE — Op Note (Signed)
NAME:  John Cuevas, John Cuevas             ACCOUNT NO.:  [id number]   MEDICAL RECORD NO.:  [id number]          PATIENT TYPE:  AMB   LOCATION:  DSC                          FACILITY:  [facility]   PHYSICIAN:  Harvie Junior, M.D.   DATE OF BIRTH:  01-31-1951   DATE OF PROCEDURE:  03/11/2008  DATE OF DISCHARGE:                               OPERATIVE REPORT   PREOPERATIVE DIAGNOSIS:  Medial meniscal tear.   POSTOPERATIVE DIAGNOSES:  1. Medial meniscal tear.  2. Chondromalacia of medial femoral condyle  3. Medial shelf plica.  4. Mild chondromalacia of the lateral compartment.   OPERATIVE PROCEDURES:  1. Partial posterior horn medial meniscectomy with corresponding      debridement in the medial femoral compartment.  2. Chondroplasty of lateral femoral condyle.  3. Medial plica excision.   SURGEON:  Harvie Junior, M.D.   ASSISTANT:  Marshia Ly, P.A.   ANESTHESIA:  General.   BRIEF HISTORY:  John Cuevas is a 60 year old male with a long history  of having had significant knee pain.  He was treated conservatively for  a period of time with antiinflammatories with medication activity  modification.  MRI was obtained which showed that he had posterior horn  medial meniscal tear.  We talked about treatment options and ultimately  felt that the most appropriate course of action for him was going to  operative knee arthroscopy.  He was brought to the operating room for  this procedure.   PROCEDURE:  The patient was brought to the operating room  and after  adequate anesthesia was obtained  with a general anesthetic, the patient  was placed supine on the operating table.  The right leg was prepped and  draped in usual sterile fashion.  Following this, routine arthroscopy  examination revealed there was an obvious posterior horn medial meniscal  tear.  This was debrided back to a smooth and stable rim with  combination of upbiting forceps, left hand side biting forceps and  straight biting  forceps.  Once this was completed, the attention was  turned to the medial femoral condyle which showed that it had some grade  3 and some small area of grade 4 change on the medial femoral condyle  which was debrided.  There was a large medial plica which was debrided  back to the wall.  Patellofemoral joint actually looked pretty  reasonable with midline tracking.  Attention was turned to the ACL.  Now  attention was turned to lateral side, which had some grade 2  chondromalacia.  This was debrided with a suction shaver back to a  smooth and stable rim of articular cartilage.  At this point the knee  was copiously and thoroughly  irrigated and suctioned dry.  The arthroscopic portals were closed with  a bandage.  Sterile compression dressing was applied, and the patient  taken to the recovery room and was noted to be in satisfactory  condition.  Estimated blood loss for this procedure was none.      Harvie Junior, M.D.  Electronically Signed     JLG/MEDQ  D:  03/11/2008  T:  03/12/2008  Job:  [id number]

## 2010-11-06 NOTE — Assessment & Plan Note (Signed)
[facility]                            CARDIOLOGY OFFICE NOTE   KUZEY, OGATA                    MRN:          [id number]  DATE:02/05/2008                            DOB:          1951-04-29    PRIMARY CARDIOLOGIST:  Dr. Clifton James.   CHIEF COMPLAINT:  Chest pain with abnormal stress test.   HISTORY OF PRESENT ILLNESS:  Mr. General is a 60 year old gentleman who  was just seen in our office last week by Dr. Clifton James.  He has multiple  cardiac risk factors that include hypertension, hyperlipidemia, and type  2 diabetes.  He has had multiple types of chest pain that have been  difficult to sort out by history.  He has a hiatal hernia with severe  reflux symptoms.  In the setting of his chest pain, he was referred for  an exercise Myoview scan.  The Myoview scan was done this morning, and  the patient had significant inferior wall ischemia with only achieving  70% of his maximum predicted heart rate.  He had 10/10 chest pain during  the procedure and also had inferior ST depression on his EKG.   Mr. Urwin chest pain resolved at the completion of his stress test.  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  He has chest pain  with bending forward which he relates to his hiatal hernia.   PAST MEDICAL HISTORY:  Includes:  1. Hypertension.  2. Type 2 diabetes.  3. Hiatal hernia.  4. Colitis.  5. Question Crohn's disease.  6. Gastroesophageal reflux disease.  7. Previous cholecystectomy.  8. Carpal tunnel release.   CURRENT MEDICATIONS:  1. Toprol XL 50 mg daily.  2. Nexium 40 mg twice daily.  3. Asacol 400 mg 3 q.i.d.  4. Avandamet 2/500 mg daily.  5. Fluticasone twice daily.   ALLERGIES:  NIACIN.   Remaining social and family history as well as review of systems were  reviewed per Dr. Gibson Ramp note dated August 6 with no additions to  make.   PHYSICAL EXAMINATION:  The patient is alert and oriented.  He is in no  acute distress.  Weight is 220 pounds, heart rate 60, blood pressure  156/81, respiratory rate 12.  HEENT:  Normal.  NECK:  Normal carotid upstrokes without bruits.  JVP normal.  LUNGS:  Clear bilaterally.  HEART:  Sounds are distant.  Regular rate and rhythm.  No murmurs or  gallops.  ABDOMEN:  Soft, obese, nontender.  No organomegaly.  No abdominal  bruits.  EXTREMITIES:  No clubbing, cyanosis or edema.  Peripheral pulses intact  and equal.   EKG per the stress lab was reviewed.  His resting EKG shows normal sinus  rhythm and is within normal limits.  At the completion of his stress  test, his EKG essentially returned to baseline with some nonspecific  inferior changes.   ASSESSMENT:  This is a 60 year old male with multiple cardiac risk  factors and a high-risk stress study.  His area of inferior wall  ischemia is  moderate.  However, this ischemic defect occurred with  relatively low-level exertion, and the patient had profound symptoms.  With intermittent chest pain throughout the day that is very difficult  to sort out by history in the setting of his severe reflux disease, I  would think it is best to admit him to the hospital for cardiac  catheterization today.  He is having rest symptoms, and I cannot tell if  there coming from cardiac ischemia or from his reflux.  His symptoms  have been relatively longstanding, and I think it is okay for him to  drive across the street to the hospital.  He is going to be admitted to  the short stay for catheterization this afternoon.  He was given an  aspirin in the office.  He is currently having no chest pain.  Further  plans pending his catheterization results.     Veverly Fells. Excell Seltzer, MD  Electronically Signed    MDC/MedQ  DD: 02/05/2008  DT: 02/05/2008  Job #: [id number]   cc:   Tammy R. Collins Scotland, M.D.

## 2010-11-06 NOTE — Op Note (Signed)
NAMEMarland Cuevas  GARAN, FRAPPIER             ACCOUNT NO.:  [id number]   MEDICAL RECORD NO.:  [id number]          PATIENT TYPE:  AMB   LOCATION:  ENDO                         [facility]ACILITY:  [facility]   PHYSICIAN:  Petra Kuba, M.D.    DATE OF BIRTH:  1951/05/13   DATE OF PROCEDURE:  01/19/2008  DATE OF DISCHARGE:                               OPERATIVE REPORT   PROCEDURE:  Colonoscopy with biopsy.   INDICATIONS:  Patient with Crohn's disease, abnormal CAT scan, want to  reevaluate.  Consent was signed after risks, benefits, methods and  options thoroughly discussed multiple times in the past.   MEDICINES USED:  Fentanyl 75 mcg, Versed 6 mg.   DESCRIPTION OF PROCEDURE:  Rectal inspection is pertinent for small  external hemorrhoids.  Digital exam was negative.  The video colonoscope  was inserted and easily advanced around the colon to the cecum.  This  did not require any abdominal pressure or any position changes.  The  cecum was identified by the appendiceal orifice and the ileocecal valve.  On insertion, some left sided colitis was seen, but no other obvious  abnormalities.  The scope was inserted a short ways in the terminal  ileum which was normal.  Photo documentation was obtained.  The scope  was slowly withdrawn.  The prep was adequate.  There was liquid stool  that required washing and suctioning.  The cecum, ascending and  transverse were all normal.  Beginning in the splenic flexure, there was  some mild to moderate colitis on the left side which ended at the  rectum.  Scattered biopsies of the colitis were obtained as was photo  documentation.  He did have some erythema and some ulcerations.  The  rectum was normal on straight visualization.  The hemorrhoids were  confirmed on retroflexion and anorectal pull through.  The scope was  straightened and readvanced a short ways up the left side of the colon.  Air was suctioned and the scope removed.  The patient tolerated the  procedure  well.  There was no obvious immediate complication.   ENDOSCOPIC DIAGNOSES:  1. Left sided colitis to the splenic flexure, skipping the rectum,      status post biopsy.  2. Otherwise within normal limits to the terminal ileum.   PLAN:  Await pathology.  Consider Cortenemas.  In the meantime, increase  Asacol to 12 a day and continue workup with an EGD and the Bravo  capsule.           ______________________________  Petra Kuba, M.D.     MEM/MEDQ  D:  01/19/2008  T:  01/19/2008  Job:  [id number]   cc:   Tammy R. Collins Scotland, M.D.  Fax: [phone]

## 2010-11-06 NOTE — Assessment & Plan Note (Signed)
[facility]                            CARDIOLOGY OFFICE NOTE   John Cuevas                    MRN:          [id number]  DATE:03/08/2008                            DOB:          Jul 16, 1950    REASON FOR VISIT:  Routine cardiac followup.   HISTORY OF PRESENT ILLNESS:  John Cuevas is a pleasant 60 year old  Caucasian male with a past medical history significant for hypertension,  borderline hyperlipidemia, type 2 diabetes mellitus, hiatal hernia,  colitis, and gastroesophageal reflux disease, who was seen in my office  initially on January 28, 2008 with complaints of mid chest pain that had  been felt to be related to his gastroesophageal reflux disease in the  past.  At that time, he stated that the pain did sometimes worsen with  exertion.  We elected to perform a myocardial perfusion stress test,  which showed significant inferior wall ischemia.  The patient was  referred later that day to [facility] for a left heart  catheterization.  I performed the left heart catheterization and have  outlined the details below.  He was found to have a 99% stenosis of the  proximal right coronary artery.  A PROMUS drug-eluting stent was placed  in the proximal right coronary artery.  The patient returns today for  followup and states that he feels great.  He has had no further symptoms  of burning in his chest.  He denies any chest pain with exertion,  dyspnea with exertion, palpitations, dizziness, near syncope, syncope,  PND, orthopnea, or lower extremity edema.  He does note pain in his  right knee, which is felt to be secondary to a meniscus tear.  He has  seen an Orthopedic surgeon and is asking today, if it would be okay for  him have an arthroscopic meniscus removal of his right knee next week.  The patient has stop taking his Lipitor secondary to pain in his knees.  He has been tolerating all his other medications without difficulty.   PAST MEDICAL HISTORY:  Now includes coronary artery disease and is  otherwise unchanged.   REVIEW OF SYSTEMS:  As stated in the history of present illness and is  otherwise negative.   CURRENT MEDICATIONS:  1. Nexium 40 mg twice daily.  2. Xyzal 5 mg once daily.  3. Fluticasone twice daily.  4. Toprol 25 mg once daily.  5. Plavix 75 mg once daily.  6. Asacol 1200 mg twice daily.  7. Metformin 500 mg once daily.  8. Lipitor 80 mg once daily (the patient has not been taking for the      last several weeks).   PHYSICAL EXAMINATION:  GENERAL:  He is a pleasant middle aged Caucasian  male in no acute distress.  VITAL SIGNS:  His weight today is 220 pounds, blood pressure is 165/94,  pulse is 83 and regular, respirations are 12 and nonlabored.  NECK:  No JVD.  No carotid bruits.  SKIN:  Warm and dry.  Oropharynx, mucous membranes are moist.  LUNGS:  Clear to auscultation bilaterally.  CARDIOVASCULAR:  Regular rate  and rhythm without murmurs, gallops, or  rubs noted.  ABDOMEN:  Soft and nontender.  Bowel sounds are present.  EXTREMITIES:  No evidence of lower extremity edema.  Pulses are 2+ in  all extremities.   DIAGNOSTIC STUDIES:  1. Left heart catheterization performed on February 05, 2008 at [facility] showed that the patient had a 20% distal left main      stenosis.  The left anterior descending artery had a 40% proximal      stenosis, a 50% mid stenosis, and a long tubular 40% stenosis in      the distal portion of the vessel.  There is a large first diagonal      vessel that had a 60% ostial lesion and a 40% lesion in the body of      the vessel.  Circumflex had a 30% proximal stenosis that was      otherwise free of disease and gave rise to a large obtuse marginal      branch.  Right coronary artery with a large dominant vessel and had      a 99% proximal stenosis.  There was a 40% stenosis noted in the      midportion of the right coronary artery and a 40%  stenosis in the      distal portion.  Right coronary artery gave rise to a large PDA      branch that had a 40% stenosis in the ostium and posterolateral      branch that had a 30% stenosis in the midportion of the vessel.      The left ventricle was noted to have an inferior wall motion      abnormality, but an overall preserved ejection fraction of 55%.      End-diastolic pressure in the left ventricle was 16.  The patient      did have a 4.0 x 80 mm PROMUS drug-eluting stent placed in the      proximal right coronary artery with post-dilatation balloon taking      this up to 4.5 mm.   ASSESSMENT AND PLAN:  John Cuevas is a pleasant 60 year old Caucasian  male with a past medical history significant for diabetes mellitus,  hypertension, hyperlipidemia and a recent diagnosis of coronary artery  disease who returns today for hospital followup following stent  placement in the right coronary artery.  He had a PROMUS drug-eluting  stent placed in the proximal right coronary artery and because of this  he should take his aspirin and Plavix for at least 1 year.  He is  tolerating his Toprol well at this point.  If his blood pressure remains  elevated, we will adjust his Toprol accordingly.  He has stopped taking  his Lipitor because of knee pain.  I suspect that his knee pain is most  likely related to musculoskeletal issues.  I have encouraged him to cut  his Lipitor and have him take 40 mg once daily to see how he tolerates  this.  I am filling out paper work today that is asking him to return to  work after the next 2 weeks of recovery.  I will make no changes in his  medications at this time.  The patient is aware that if he should have  any surgeries performed, his Plavix should not be stopped.  He also  asked today if he could have some teeth extracted; however, I think that  this  should wait until he has been on Plavix for at least 6 months.  I  will plan on seeing him back in my  office in 6 months' time.     John Carrow, MD  Electronically Signed    CM/MedQ  DD: 03/08/2008  DT: 03/08/2008  Job #: [id number]   cc:   Harvie Junior, M.D.

## 2010-11-06 NOTE — Cardiovascular Report (Signed)
NAME:  John Cuevas, John Cuevas NO.:  [id number]   MEDICAL RECORD NO.:  [id number]          PATIENT TYPE:  INP   LOCATION:  6532                         FACILITY:  [facility]   PHYSICIAN:  Verne Carrow, MDDATE OF BIRTH:  [birth year]   DATE OF PROCEDURE:  02/05/2008  DATE OF DISCHARGE:                            CARDIAC CATHETERIZATION   INDICATIONS:  A 60 year old white male with history of gastroesophageal  reflux disease, hypertension, hyperlipidemia, and diabetes mellitus, who  has had recent worsening of his chest pain and was found today to have a  severe inferoposterior perfusion defect on myocardial perfusion study.   PROCEDURES:  1. Left heart catheterization  2. Selective coronary angiography.  3. Left ventricular angiogram.  4. Percutaneous coronary intervention with placement of a Promus drug-      eluting stent in the proximal right coronary artery.   OPERATOR:  Verne Carrow, MD   FINDINGS:  1. Left main coronary artery has a distal 20% stenosis.  This gives      rise to the circumflex and LAD.  2. Left anterior descending has a 40% proximal stenosis, a 50% mid      stenosis, and a long tubular 40% stenosis in the distal portion of      the vessel.  There is a large first diagonal vessel that has a 60%      ostial lesion and a 40% lesion in the body of the vessel.  3. The circumflex has a 30% proximal stenosis and is otherwise free of      disease.  This gives rise to a large obtuse marginal branch.  4. Right coronary artery is a large dominant vessel and has a 99%      proximal stenosis.  There is a 40% stenosis in the midportion of      this vessel and a 40% stenosis in the distal portion.  The right      coronary artery gives rise to a large posterior descending artery      that has a 40% stenosis in the ostium and a posterolateral branch      has a 30% stenosis in the midportion of the vessel.  5. Left ventricle.  There is an inferior wall  motion abnormality, but      an overall preserved ejection fraction of 55%.  There is moderate      mitral regurgitation noted.  Left ventricular pressures were 140/10      with an end-diastolic pressure of 16.   PROCEDURE IN DETAIL:  The patient was brought to the diagnostic heart  catheterization laboratory after signing informed consent.  Diagnostic  heart catheterization was performed through a 6-French sheath that was  inserted into the right femoral artery.  6-French diagnostic catheters  were used to engage the coronary arteries.  A JL4 was used for the left  coronary system  and a JR4 was used for the right coronary artery.  A  left ventriculogram was performed with a 6-French pigtail catheter.  Following the diagnostic catheterization, the patient was given 600 mg  of Plavix and Angiomax was started for anticoagulation.  A 6-French JR4  guiding catheter was inserted into the right coronary artery.  A Cougar  intracoronary wire was passed down the right coronary artery without  difficulty.  I attempted to pass a Fetch thrombectomy catheter, but was  unable to pass the tight stenosis.  A 3.0 mm x 15 mm VOYAGER balloon was  used for predilatation of the lesion.  A 4.0 mm x 18 mm Promus drug-  eluting stent was placed in the proximal right coronary artery.  A 4.5  mm x 12 mm Quantum Maverick noncompliant balloon was used for post  dilatation of the lesion at low atmospheres (7 and 8 atmospheres  respectively).  There was TIMI 3 flow down the vessel following the  intervention.  The patient was without complaints and was transferred to  the holding area in stable condition after an Angio-Seal closure device  was used to achieve hemostasis in the right femoral artery.   IMPRESSION:  1. Double-vessel coronary artery disease.  2. Status post successful percutaneous coronary intervention of the      proximal right coronary artery with placement of a Promus drug-      eluting stent.   3. Segmental wall motion abnormality of left ventricle with overall      preserved ejection fraction.  4. Moderate mitral regurgitation.   RECOMMENDATIONS:  Continue aspirin, statin, beta-blocker, and Plavix for  at least 1 year.  Echocardiogram will be performed on a yearly basis to  assess his mitral regurgitation.      Verne Carrow, MD  Electronically Signed     CM/MEDQ  D:  02/05/2008  T:  02/06/2008  Job:  [id number]

## 2010-11-06 NOTE — Assessment & Plan Note (Signed)
[facility]                            CARDIOLOGY OFFICE NOTE   [name], [name]                    MRN:          [id number]  DATE:01/28/2008                            DOB:          02-Apr-1951    HISTORY OF PRESENT ILLNESS:  Mr. John Cuevas is a pleasant 60 year old  Caucasian male with a past medical history significant for hypertension,  borderline hyperlipidemia, type 2 diabetes mellitus, hiatal hernia,  colitis, and gastroesophageal reflux disease who presents today for  evaluation of chest discomfort.  The patient states that he has been  dealing with severe gastroesophageal reflux disease since 1999.  He  describes these symptoms as a severe burning in his upper mid chest that  extends into his neck, jaw, and bilateral shoulders.  This is usually  worsened after meals and when leaning forward.  There is not a real  association with exertion, however, he says that it does typically  become slightly worse with exertion.  He believes that his pain is  present during most of the day, much of the time.  Currently, he has  been evaluated with a recent EGD and colonoscopy.  He states that he was  told, his colonoscopy showed some inflammation and colitis.  He also has  a monitor in his esophagus for his acid reflux.  Chest pain does not  sound to be exclusively exertional.  There is no associated shortness of  breath, dizziness, near syncope, syncope, palpitations, diaphoresis,  nausea, or vomiting.  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  He has not been limited in any of his activities of  daily living secondary to the pain.  He denies any paroxysmal nocturnal  dyspnea, orthopnea, or lower extremity edema.   PAST MEDICAL HISTORY:  1. Hypertension.  2. Type 2 diabetes mellitus.  3. Hiatal hernia.  4. Colitis - unspecified.  The patient is unable to tell me if this      has been diagnosed as ulcerative colitis in the  past.  5. Gastroesophageal reflux disease.   PAST SURGICAL HISTORY:  1. Cholecystectomy.  2. Carpal tunnel release.   ALLERGIES:  NIACIN.   MEDICATIONS:  1. Toprol-XL 50 mg once daily.  2. Nexium 40 mg twice daily.  3. Asacol 400 mg 3 tablets 4 times daily.  4. Avandamet 2 mg/500 mg once daily.  5. Xyzal 5 mg once daily.  6. Fluticasone 2 puffs twice daily.  7. Cefprozil 500 mg 2 tablets once daily.   SOCIAL HISTORY:  The patient is divorced and has 2 healthy children.  He  is currently employed as a Fish farm manager on a Therapist, nutritional.  He did  smoke 1-2 packs of cigarettes per day for 17-20 years.  He stopped  smoking in 1999.  He denies the use of any alcoholic beverages over the  last 10 years.  He denies any illicit drug use.   FAMILY HISTORY:  The patient's mother and father are both alive and  healthy.  He has no family history of premature coronary artery disease  or sudden  cardiac death.   REVIEW OF SYSTEMS:  As stated in the history of present illness, is  otherwise negative.   PHYSICAL EXAMINATION:  John Cuevas SIGNS:  Blood pressure 132/76, pulse 72 and  regular, respirations 12 and nonlabored.  GENERAL:  He is a pleasant middle-aged Caucasian male who is well  appearing and in no acute distress.  NECK:  No JVD.  No carotid bruits noted.  SKIN:  Warm and dry.  LUNGS:  Clear to auscultation bilaterally with no wheezes, rhonchi, or  crackles noted.  CARDIOVASCULAR:  Regular rate and rhythm with no murmurs, gallops, or  rubs noted.  There are lifts or thrills noted.  ABDOMEN:  Obese, soft, nontender, bowel sounds present.  EXTREMITIES:  No evidence of edema bilaterally.  Pulses are 2+ in the  bilateral radial arteries, bilateral dorsalis pedis arteries, and  bilateral posterior tibial arteries.  NEUROLOGIC:  There are no focal neurological deficits noted.   DIAGNOSTIC STUDIES:  A 12-lead electrocardiogram obtained in our office  today shows normal sinus rhythm with a  ventricular rate of 72 beats per  minute, normal QT interval, normal PR interval of 180 milliseconds,  normal QRS duration of 98 milliseconds.  There is no evidence of bundle  branch block.  This is a normal EKG.   ASSESSMENT AND PLAN:  This is a pleasant 60 year old Caucasian male with  several risk factors for coronary artery disease including hypertension,  age, obesity, and diabetes mellitus who presents with complaints of  substernal chest discomfort, that could be concerning for coronary  artery disease.  As stated above, the patient does have a history of  severe gastroesophageal reflux disease, which is currently being worked  up by his gastroenterology physician.  I do think with his risk factors  it would be wise at this point to proceed with cardiac workup to rule  out coronary artery disease.  His EKG shows no signs of ischemia and is  normal.  I would like to obtain a 2-D surface echocardiogram, here in  our office.  We will also perform a treadmill exercise, nuclear stress  study to rule out any coronary ischemia.  The patient is currently on a  beta-blocker medication.  I will not add any medications at the current  time.  We will plan on seeing him on return in our office in 4-6 weeks  to review the results of his testing.  Should we find any abnormalities on this testing that is concerning we  would alert him prior to his appointment and arrange for appropriate  followup at that time.     Verne Carrow, MD  Electronically Signed    CM/MedQ  DD: 01/28/2008  DT: 01/29/2008  Job #: [id number]   cc:   Pricilla Riffle, MD, [facility]  Tammy R. Collins [place], M.D.

## 2010-11-06 NOTE — Op Note (Signed)
NAMEMarland Kitchen  John Cuevas, John Cuevas             ACCOUNT NO.:  [id number]   MEDICAL RECORD NO.:  [id number]          PATIENT TYPE:  AMB   LOCATION:  ENDO                         FACILITY[facility]  [facility]   PHYSICIAN:  Petra Kuba, M.D.    DATE OF BIRTH:  March 08, 1951   DATE OF PROCEDURE:  01/19/2008  DATE OF DISCHARGE:                               OPERATIVE REPORT   PROCEDURE:  EGD with Bravo capsule placement.   INDICATIONS:  Patient with chronic reflux, chronic chest pain.  Want to  further evaluate.  Consent was signed after risks, benefits, methods,  and options thoroughly discussed multiple times in the past.   ADDITIONAL MEDICATIONS FOR THIS PROCEDURE:  1. Fentanyl 50 mcg.  2. Versed 4.5 mg.   PROCEDURE:  The video endoscope was inserted by direct vision.  The  esophagus was normal.  He did have a small hiatal hernia.  Scope passed  into the stomach and advanced to the antrum, where some minimal antritis  was seen, advanced through a normal pylorus, into a normal duodenal  bulb, and around the C-loop to a normal second portion of the duodenum.  The scope was slowly withdrawn back to the bulb, and a good look there  ruled out abnormalities at that location.  Scope was withdrawn back to  the stomach and retroflexed.  Angularis, cardia, fundus, lesser and  greater curve were evaluated on retroflexion and straight visualization.  Other than the hiatal hernia being confirmed in the cardia, no  additional findings were seen.  The scope was then slowly withdrawn back  through the esophagus, which again confirmed its normal appearance.  We  went ahead and measured the hiatal hernia with the scope, and the GE  junction was about at 38 cm, so we elected to place the Bravo capsule at  32 cm.  The scope was removed.  A good look at the vocal cords were  normal.  We went ahead and set up the Bravo capsule once the scope was  removed, and the mark was placed at 32 cm.  Unfortunately, with the  first Bravo  capsule, the suction mechanism did not work, and the Bravo  stayed attached to the introducer.  We then used a second Bravo in the  customary fashion, and this one did work.  The scope was reinserted,  which confirmed proper positioning of the Bravo in the esophagus,  although it was slightly higher at about 28 cm, measured with the scope.  The scope was removed.  The patient tolerated the procedure well.  There  was no obvious immediate complication.   ENDOSCOPIC DIAGNOSES:  1. Small hiatal hernia.  2. Minimal antritis.  3. Otherwise, normal esophagogastroduodenoscopy.   THERAPY:  Bravo capsule placed in the customary fashion on the second  attempt to about 28 cm.   PLAN:  Await Bravo reading and pathology from colon, and then decide  further workup and plans at that junction.           ______________________________  Petra Kuba, M.D.     MEM/MEDQ  D:  01/19/2008  T:  01/19/2008  Job:  [id number]   cc:   Tammy R. Collins [place], M.D.  Fax: [phone]

## 2010-11-06 NOTE — Op Note (Signed)
NAMEMONTAY, VANVOORHIS             ACCOUNT NO.:  [id number]   MEDICAL RECORD NO.:  [id number]          PATIENT TYPE:  AMB   LOCATION:  ENDO                         FACILITY:  [facility]   PHYSICIAN:  Petra Kuba, M.D.    DATE OF BIRTH:  05-03-1951   DATE OF PROCEDURE:  01/19/2008  DATE OF DISCHARGE:  01/19/2008                               OPERATIVE REPORT   PROCEDURE:  Bravo capsule report.   INDICATIONS:  Patient with questionable reflux causing multiple  complaints.  Consent was signed after risks, benefits, methods, options  thoroughly discussed.   PROCEDURE:  The Bravo was placed in endoscopy on the 28th, and the  computer generated report given to me for my interpretation.   ASSESSMENT:  Day 1 acid reflux analysis.  The patient had minimal  reflux, did not report any symptoms.  His DeMeester score was 2.5,  normal less than 14.72.  On day 2, his DeMeester score was even lower at  1.7.  Again, he did not complain of much symptoms, so it was difficult  to correlate, but his total amount of reflux was well within the normal  limits.   PLAN:  Will discuss this normal pH monitor with the patient.  I truly  believe that hopefully all of his symptoms will go away when his stress  decreases and he retires.  We could consider fixing his hiatal hernia,  but I think it is very low chance that will help his symptoms.  Might re-  refer him back for ENT or a university consult for these symptoms and  will see him back and 4-6 weeks to recheck on.           ______________________________  Petra Kuba, M.D.     MEM/MEDQ  D:  01/21/2008  T:  01/21/2008  Job:  [id number]

## 2010-11-09 NOTE — Op Note (Signed)
NAME:  John Cuevas, John Cuevas             ACCOUNT NO.:  [id number]   MEDICAL RECORD NO.:  [id number]          PATIENT TYPE:  AMB   LOCATION:  ENDO                         FACILITY:  [facility]   PHYSICIAN:  Petra Kuba, M.D.    DATE OF BIRTH:  1950-09-28   DATE OF PROCEDURE:  07/24/2004  DATE OF DISCHARGE:                                 OPERATIVE REPORT   PROCEDURE:  Manometry.   INDICATIONS FOR PROCEDURE:  Preop for consideration of hiatal hernia repair.   DESCRIPTION OF PROCEDURE:  Manometry was done by the endoscopy nurse in the  customary fashion and the computer generated report was given to me for my  interpretation.   ASSESSMENT:  1.  LES had a slight increased pressure to 52, normal being 45.  Residual      pressure was normal and percent relaxation was OK.  2.  Normal esophageal body, had normal amplitude, normal duration,      essentially normal velocities except for a peak velocity of 2.9 with      normal being 3 with 100% peristalsis contractions.  3.  Upper body amplitude and duration were normal as were wave forms.  4.  Upper esophageal sphincter normal pressure, residual pressure, 100%      relaxation and duration.  5.  Normal pharyngeal pressures with normal peristalsis.   IMPRESSION:  Essentially normal manometry except for increased LES and  pressure.   PLAN:  Would probably not fix his hiatal hernia based on the increased LES  pressure, we can wait on endoscopy to recheck its severity, would probably  just proceed with cholecystectomy based on his abdominal pain, might  consider 24 hour pH off all meds to try to document reflux quantity if we  are still thinking about repair.      MEM/MEDQ  D:  07/24/2004  T:  07/24/2004  Job:  [id number]   cc:   Gita Kudo, M.D.  [address], [address]  [place]  [place] [address]   Tammy R. Collins Scotland, M.D.  [address]  [place]  [place] [address]  Fax: [phone]

## 2010-11-09 NOTE — Op Note (Signed)
NAME:  John Cuevas, John Cuevas             ACCOUNT NO.:  [id number]   MEDICAL RECORD NO.:  [id number]          PATIENT TYPE:  AMB   LOCATION:  ENDO                         FACILITY:  [facility]   PHYSICIAN:  Petra Kuba, M.D.    DATE OF BIRTH:  1950/12/07   DATE OF PROCEDURE:  08/23/2004  DATE OF DISCHARGE:                                 OPERATIVE REPORT   PROCEDURE:  Esophagogastroduodenoscopy with biopsy.   INDICATIONS FOR PROCEDURE:  The patient with some reflux and gallstones want  to evaluate for questionable hiatal hernia surgery.   Consent was signed after risks, benefits, methods, and options were  thoroughly discussed in the office on multiple occasions.   MEDICINES USED:  Fentanyl 75 mcg, Versed 7 mg.   DESCRIPTION OF PROCEDURE:  The video endoscope was inserted by direct  vision, the esophagus was normal. In the distal esophagus was a tiny hiatal  hernia at best. No signs of esophagitis were seen. The scope passed easily  into the stomach, advanced to the antrum where some mild antritis was seen,  advanced through a normal pylorus into a normal duodenal bulb and around the  C loop to a normal second portion of the duodenum.  The scope was withdrawn  back to the bulb and a good look there ruled out abnormalities in that  location.  The scope was withdrawn back to the stomach and retroflexed. The  cardia, fundus, angularis, lesser and greater curve were normal on  retroflexed visualization.  Straight visualization of the stomach did not  reveal any additional findings. We went ahead and took a few biopsies of the  antrum and a few of the proximal stomach to rule out helicobacter. The scope  was then slowly withdrawn again, the esophagus was normal. We went ahead and  took a few biopsies of the distal esophagus to rule out any microscopic or  reflux changes, put them in a separate container. The scope was removed. The  patient tolerated the procedure well. There was no obvious or  immediate  complication.   ENDOSCOPIC DIAGNOSIS:  1.  Tiny hiatal hernia.  2.  Normal esophagus status post distal biopsy.  3.  Mild antritis status post biopsy and proximal stomach biopsied to rule      out helicobacter.  4.  Otherwise normal EGD.   PLAN:  Await pathology.  I would probably proceed with a cholecystectomy  only and not try to fix this hiatal hernia because of it being only tiny,  because of the manometry showing an increased pressure and for the medicines  not  helping his symptoms. There are some studies that imply that operating on  hiatal hernias where the medicines are not helpful do not help the symptoms  and I tend to agree with that. Would be happy to discuss with Dr. Maryagnes Amos  further and will refer patient back to him to get his opinion and be on  standby to help p.r.n.      MEM/MEDQ  D:  08/23/2004  T:  08/23/2004  Job:  [id number]   cc:   Gita Kudo, M.D.  [address]  [place]  [place] [address]   Tammy R. Collins Scotland, M.D.  [address]  [place]  [place] [address]  Fax: [phone]

## 2010-11-09 NOTE — Op Note (Signed)
NAME:  John Cuevas, John Cuevas                       ACCOUNT NO.:  [id number]   MEDICAL RECORD NO.:  [id number]                   PATIENT TYPE:  AMB   LOCATION:  ENDO                                 FACILITY:  [facility]   PHYSICIAN:  Petra Kuba, M.D.                 DATE OF BIRTH:  [birth year]   DATE OF PROCEDURE:  DATE OF DISCHARGE:                                 OPERATIVE REPORT   PROCEDURE:  Colonoscopy with biopsy.   INDICATIONS:  The patient with a history of polyps, here for repeat  screening, also with a vague history of Crohn's disease and multiple GI  complaints. Consent was signed after the risks, benefits, methods and  options were thoroughly multiple times in the past.   MEDICATIONS:  Fentanyl 75 mcg, Versed 10 mg.   PROCEDURE:  The rectal inspection was pertinent for external hemorrhoids. A  small digital examination was negative. The video colonoscope was inserted  and easily advanced around the colon to the cecum. This did not require any  abdominal pressure or any position changes.   On  insertion to the mid transverse, three probable pseudopolyps versus  regular polyps were seen in a line and were cold biopsied multiple times and  put in the first container. No other abnormality was seen  as we advanced  through the cecum which was pertinent for the presence of the appendiceal  orifice and the ileocecal valve.  The scope was inserted a short way into the terminal ileum which was normal.  Photodocumentation was obtained.   The scope was slowly withdrawn. On slow withdrawal through the colon, the  cecum and the ascending were normal. The scope was withdrawn back to the  area of the pseudopolyps and no obvious residual polypoid tissue was seen.  Just distal to this in the distal transverse proximal descending was a  slight granular pattern with slight inflammation and biopsies were obtained  of this area and put in a second container.   The scope was further  withdrawn. Not mentioned above the prep was adequate.  There was some liquid stool that required washing and suctioning. On slow  withdrawal through the left side of the colon no additional findings were  seen.   Once back in the rectum the scope was retroflexed. Pertinent for some small  internal hemorrhoids. The scope was then readvanced towards the left side of  the colon. Air was suctioned and the scope was removed.   The patient tolerated the procedure well. There were no obvious immediate  complications.   ENDOSCOPIC DIAGNOSES:  1. Small internal and external hemorrhoids.  2. Minimal granular appearance of the descending and transverse colon,     status post biopsy.  3. A questionable small area of transverse pseudopolyps versus regular     polyps. Cold biopsies were put in container #1.  4. Otherwise within normal limits to the terminal ileum.  PLAN:  Await pathology to determine future colonic screening and determine  medical changes. A GI follow up p.r.n. or in three to four months if doing  well.                                               Petra Kuba, M.D.    MEM/MEDQ  D:  05/11/2002  T:  05/11/2002  Job:  [id number]   cc:   Tammy R. Collins Scotland, M.D.

## 2010-11-21 ENCOUNTER — Encounter: Payer: Self-pay | Admitting: Cardiovascular Disease

## 2010-11-29 ENCOUNTER — Ambulatory Visit (INDEPENDENT_AMBULATORY_CARE_PROVIDER_SITE_OTHER): Payer: 59 | Admitting: Cardiovascular Disease

## 2010-11-29 ENCOUNTER — Encounter: Payer: Self-pay | Admitting: Cardiovascular Disease

## 2010-11-29 VITALS — BP 124/82 | HR 77 | Resp 14 | Ht 67.0 in | Wt 197.0 lb

## 2010-11-29 DIAGNOSIS — I2510 Atherosclerotic heart disease of native coronary artery without angina pectoris: Secondary | ICD-10-CM

## 2010-11-29 DIAGNOSIS — K5090 Crohn's disease, unspecified, without complications: Secondary | ICD-10-CM

## 2010-11-29 NOTE — Assessment & Plan Note (Addendum)
Stable. No changes in therapy. I do not think his symptoms are cardiac related. Continue ASA and Toprol. He has had his GI issues followed by [facility]. He wishes to get a second opinion so we will refer him to [facility].

## 2010-11-29 NOTE — Patient Instructions (Signed)
Your physician recommends that you schedule a follow-up appointment in: 6 months with Dr. Clifton James  You have been referred to [facility] to see Dr. Juanda Chance or Dr. Christella Hartigan.

## 2010-11-29 NOTE — Progress Notes (Signed)
History of Present Illness:60 yo WM with h/o HTN, Hyperlipidemia, Crohns disease, DM and CAD diagnosed by cath August 2009 here for follow up. Pt had DES placed in proximal RCA August 2009 and  drug eluting stents in the LAD and the OM in February 2011. He returns today for follow up. He has had problems with recent sinus infections. He took antibiotics and had excessive diarrhea. He lost 13 pounds. He continues to have problems with his hiatal hernia. The pain in his chest has been associated with meals and is reproducible with touching his chest wall. He was started on an antibiotic by his GI specialist. This caused him to have fever blisters on his lips. He has face numbness with eating.   From a cardiac standpoint, he has had no exertional chest pain, SOB. He has been off of a statin for the last year secondary to diffuse muscle aches on statins.    Past Medical History  Diagnosis Date  . Diabetes mellitus, type 2   . Hypertension   . Hiatal hernia   . Crohn's disease   . GERD (gastroesophageal reflux disease)     Past Surgical History  Procedure Date  . Cholecystectomy   . Laporoscopic knee surgery   . Carpal tunnel release     Current Outpatient Prescriptions  Medication Sig Dispense Refill  . aspirin (ASPIR-81) 81 MG EC tablet Take 81 mg by mouth daily.        Marland Kitchen esomeprazole (NEXIUM) 40 MG capsule Take 40 mg by mouth daily.        Marland Kitchen levocetirizine (XYZAL) 5 MG tablet Take 5 mg by mouth at bedtime.        . mesalamine (ASACOL) 400 MG EC tablet Take 400 mg by mouth. Pt varies does per daily       . metoprolol (TOPROL-XL) 50 MG 24 hr tablet Take 50 mg by mouth daily.        . metoprolol succinate (TOPROL XL) 25 MG 24 hr tablet Take 25 mg by mouth daily. As needed at bedtime for elevated blood pressure         . NITROSTAT 0.4 MG SL tablet DISSOLVE 1 TAB UNDER TONGUE EVERY 5 MIN AS NEEDED FOR CHEST PAIN MAY REPEAT 3 TIMES  25 tablet  2  . Probiotic Product (VSL#3) CAPS Take by mouth  as directed.        Marland Kitchen DISCONTD: metoprolol succinate (TOPROL XL) 25 MG 24 hr tablet Take 50 mg by mouth daily.        Marland Kitchen DISCONTD: prasugrel (EFFIENT) 10 MG TABS Take by mouth daily.          Allergies  Allergen Reactions  . Chlorthalidone     REACTION: dizzy  . Clopidogrel Bisulfate     REACTION: leg pain and fluid on knee  . Dicyclomine     REACTION: dizzy/bloated  . Doxycycline     REACTION: dizzy  . Metformin     REACTION: headache  . Niacin     REACTION: rash    History   Social History  . Marital Status: Divorced    Spouse Name: N/A    Number of Children: N/A  . Years of Education: N/A   Occupational History  . Not on file.   Social History Main Topics  . Smoking status: Former Games developer  . Smokeless tobacco: Not on file  . Alcohol Use: No  . Drug Use: No  . Sexually Active: Not on file  Other Topics Concern  . Not on file   Social History Narrative   Divorced, 2 children. Smoked 1-2ppd for 20 years but stopped in 1999.     No family history on file.  Review of Systems:  As stated in the HPI and otherwise negative.   BP 124/82  Pulse 77  Resp 14  Ht 5\' 7"  (1.702 m)  Wt 197 lb (89.359 kg)  BMI 30.85 kg/m2  Physical Examination: General: Well developed, well nourished, NAD HEENT: OP clear, mucus membranes moist SKIN: warm, dry. No rashes. Neuro: No focal deficits Musculoskeletal: Muscle strength 5/5 all ext Psychiatric: Mood and affect normal Neck: No JVD, no carotid bruits, no thyromegaly, no lymphadenopathy. Lungs:Clear bilaterally, no wheezes, rhonci, crackles Cardiovascular: Regular rate and rhythm. No murmurs, gallops or rubs. Abdomen:Soft. Bowel sounds present. Non-tender.  Extremities: No lower extremity edema. Pulses are 2 + in the bilateral DP/PT.  EKG:NSR, rate 79 bpm. Normal EKG

## 2010-12-06 ENCOUNTER — Telehealth: Payer: Self-pay | Admitting: Cardiovascular Disease

## 2010-12-06 NOTE — Telephone Encounter (Addendum)
ROI sent to Dr.Magod Office @ [phone]   12/06/10/km  Records received from Dr.Magod Office gave to [facility]  12/07/10/km

## 2010-12-12 ENCOUNTER — Telehealth: Payer: Self-pay

## 2010-12-12 NOTE — Telephone Encounter (Signed)
Dr Christella Hartigan declined to accept the pt.  The pt was called and informed of his descision.

## 2011-01-09 ENCOUNTER — Telehealth: Payer: Self-pay | Admitting: Cardiovascular Disease

## 2011-01-09 NOTE — Telephone Encounter (Signed)
12 Lead faxed to [facility] @ [phone]  01/09/11/km

## 2011-03-22 LAB — BASIC METABOLIC PANEL
CO2: 25
Chloride: 108
GFR calc Af Amer: >60
Potassium: 3.9

## 2011-03-22 LAB — CBC
HCT: 44.7
Hemoglobin: 15.2
MCHC: 34.1
MCV: 87.6
RBC: 5.1
WBC: 5.5

## 2011-03-22 LAB — GLUCOSE, CAPILLARY
Glucose-Capillary: 130 — ABNORMAL HIGH
Glucose-Capillary: 183 — ABNORMAL HIGH

## 2011-03-25 LAB — POCT HEMOGLOBIN-HEMACUE: Hemoglobin: 15.5

## 2011-03-25 LAB — GLUCOSE, CAPILLARY
Glucose-Capillary: 123 — ABNORMAL HIGH
Glucose-Capillary: 141 — ABNORMAL HIGH

## 2011-05-28 ENCOUNTER — Ambulatory Visit (INDEPENDENT_AMBULATORY_CARE_PROVIDER_SITE_OTHER): Payer: 59 | Admitting: Cardiovascular Disease

## 2011-05-28 ENCOUNTER — Encounter: Payer: Self-pay | Admitting: Cardiovascular Disease

## 2011-05-28 VITALS — BP 139/83 | HR 69 | Ht 67.0 in | Wt 211.0 lb

## 2011-05-28 DIAGNOSIS — I10 Essential (primary) hypertension: Secondary | ICD-10-CM

## 2011-05-28 DIAGNOSIS — I2510 Atherosclerotic heart disease of native coronary artery without angina pectoris: Secondary | ICD-10-CM

## 2011-05-28 NOTE — Assessment & Plan Note (Signed)
Stable. He has been on a beta blocker and an ASA. He is tolerating this well. He does not tolerate statins. He has been off of statins over the last year.

## 2011-05-28 NOTE — Patient Instructions (Signed)
Your physician wants you to follow-up in: 6 months  You will receive a reminder letter in the mail two months in advance. If you don't receive a letter, please call our office to schedule the follow-up appointment.  Your physician recommends that you continue on your current medications as directed. Please refer to the Current Medication list given to you today.  

## 2011-05-28 NOTE — Progress Notes (Signed)
History of Present Illness: 60 yo WM with h/o HTN, Hyperlipidemia, Crohns disease, DM, hiatal hernia and CAD diagnosed by cath August 2009 here for follow up. Pt had DES placed in proximal RCA August 2009 and drug eluting stents in the LAD and the OM in February 2011. He returns today cardiac for follow up. His main problem over the last year has been GI related. He has been seen by [facility] Dr. Ewing Schlein but asked at his last visit to be referred to [facility]. We made that referral but they did not agree to see him. He has now switched his care to Dr. Merri Brunette at [facility] in the GI Department. His GI issues are better since he has been treated there.   From a cardiac standpoint, he has had no exertional chest pain, SOB. He is very active and has been doing pushups and chopping wood. He has been off of a statin because of joint aches.  His most recent lipid profile at his GI specialist was controlled per pt.   His primary care is Dr. Herb Grays.    Past Medical History  Diagnosis Date  . Diabetes mellitus, type 2   . Hypertension   . Hiatal hernia   . Crohn's disease   . GERD (gastroesophageal reflux disease)   . Coronary artery disease     Past Surgical History  Procedure Date  . Cholecystectomy   . Laporoscopic knee surgery   . Carpal tunnel release     Current Outpatient Prescriptions  Medication Sig Dispense Refill  . aspirin (ASPIR-81) 81 MG EC tablet Take 81 mg by mouth daily.        Marland Kitchen esomeprazole (NEXIUM) 40 MG capsule Take 40 mg by mouth daily.        Marland Kitchen levocetirizine (XYZAL) 5 MG tablet Take 5 mg by mouth at bedtime.        . mesalamine (ASACOL) 400 MG EC tablet Take 400 mg by mouth. Pt varies does per daily       . metoprolol (TOPROL-XL) 50 MG 24 hr tablet Take 50 mg by mouth daily.        Marland Kitchen NITROSTAT 0.4 MG SL tablet DISSOLVE 1 TAB UNDER TONGUE EVERY 5 MIN AS NEEDED FOR CHEST PAIN MAY REPEAT 3 TIMES  25 tablet  2    Allergies  Allergen Reactions  .  Chlorthalidone     REACTION: dizzy  . Clopidogrel Bisulfate     REACTION: leg pain and fluid on knee  . Dicyclomine     REACTION: dizzy/bloated  . Doxycycline     REACTION: dizzy  . Metformin     REACTION: headache  . Niacin     REACTION: rash    History   Social History  . Marital Status: Divorced    Spouse Name: N/A    Number of Children: N/A  . Years of Education: N/A   Occupational History  . Not on file.   Social History Main Topics  . Smoking status: Former Games developer  . Smokeless tobacco: Not on file  . Alcohol Use: No  . Drug Use: No  . Sexually Active: Not on file   Other Topics Concern  . Not on file   Social History Narrative   Divorced, 2 children. Smoked 1-2ppd for 20 years but stopped in 1999.     No family history on file.  Review of Systems:  As stated in the HPI and otherwise negative.   BP  139/83  Pulse 69  Ht 5\' 7"  (1.702 m)  Wt 211 lb (95.709 kg)  BMI 33.05 kg/m2  Physical Examination: General: Well developed, well nourished, NAD HEENT: OP clear, mucus membranes moist SKIN: warm, dry. No rashes. Neuro: No focal deficits Musculoskeletal: Muscle strength 5/5 all ext Psychiatric: Mood and affect normal Neck: No JVD, no carotid bruits, no thyromegaly, no lymphadenopathy. Lungs:Clear bilaterally, no wheezes, rhonci, crackles Cardiovascular: Regular rate and rhythm. No murmurs, gallops or rubs. Abdomen:Soft. Bowel sounds present. Non-tender.  Extremities: No lower extremity edema. Pulses are 2 + in the bilateral DP/PT.

## 2011-05-28 NOTE — Assessment & Plan Note (Signed)
BP is borderline elevated today. It has been well controlled at home.

## 2011-11-25 ENCOUNTER — Ambulatory Visit (INDEPENDENT_AMBULATORY_CARE_PROVIDER_SITE_OTHER): Payer: 59 | Admitting: Cardiovascular Disease

## 2011-11-25 ENCOUNTER — Encounter: Payer: Self-pay | Admitting: Cardiovascular Disease

## 2011-11-25 VITALS — BP 150/90 | HR 68 | Ht 67.0 in | Wt 223.0 lb

## 2011-11-25 DIAGNOSIS — E785 Hyperlipidemia, unspecified: Secondary | ICD-10-CM

## 2011-11-25 DIAGNOSIS — I10 Essential (primary) hypertension: Secondary | ICD-10-CM

## 2011-11-25 DIAGNOSIS — I2510 Atherosclerotic heart disease of native coronary artery without angina pectoris: Secondary | ICD-10-CM

## 2011-11-25 LAB — LIPID PANEL
Cholesterol: 174 mg/dL (ref 0–200)
VLDL: 23.6 mg/dL (ref 0.0–40.0)

## 2011-11-25 NOTE — Assessment & Plan Note (Signed)
BP well controlled at home. He does not wish to start any additional BP meds.

## 2011-11-25 NOTE — Patient Instructions (Signed)
Your physician wants you to follow-up in:  6 months.  You will receive a reminder letter in the mail two months in advance. If you don't receive a letter, please call our office to schedule the follow-up appointment.  Your physician has requested that you have an exercise stress myoview. For further information please visit [email or web address]. Please follow instruction sheet, as given.  

## 2011-11-25 NOTE — Assessment & Plan Note (Signed)
Check lipids today. He has not been on a statin secondary to muscle aches.

## 2011-11-25 NOTE — Progress Notes (Signed)
History of Present Illness: 61 yo WM with h/o HTN, Hyperlipidemia, Crohns disease, DM, hiatal hernia and CAD diagnosed by cath August 2009 here for follow up. Pt had DES placed in proximal RCA August 2009 and drug eluting stents in the LAD and the OM in February 2011. His main problem over the last year has been GI related. He has been seen by [facility] Dr. Ewing Schlein but asked to be referred to [facility]. We made that referral but they did not agree to see him. He has now switched his care to Dr. Merri Brunette at [facility] in the GI Department. His GI issues are better since he has been treated there.   He is here today for follow up. He tells me that he has been having central chest pains with radiation into his left arm. This has been occurring for several months. He has been active. This pain occurs at rest and with exertion. He cannot tell me how his chest felt before his stent placement because he had no some much heartburn at that time. He has been off of a statin because of joint aches. His most recent lipid profile at his GI specialist was controlled per pt.   Primary Care Physician: Herb Grays  Last Lipid Profile: In GI clinic.   Past Medical History  Diagnosis Date  . Diabetes mellitus, type 2   . Hypertension   . Hiatal hernia   . Crohn's disease   . GERD (gastroesophageal reflux disease)   . Coronary artery disease     DES placed in proximal RCA August 2009 and drug eluting stents in the LAD and the OM in February 2011    Past Surgical History  Procedure Date  . Cholecystectomy   . Laporoscopic knee surgery   . Carpal tunnel release     Current Outpatient Prescriptions  Medication Sig Dispense Refill  . aspirin (ASPIR-81) 81 MG EC tablet Take 81 mg by mouth daily.        . clobetasol cream (TEMOVATE) 0.05 % As needed      . esomeprazole (NEXIUM) 40 MG capsule Take 40 mg by mouth daily.        . fluticasone (FLONASE) 50 MCG/ACT nasal spray As  needed      .  levocetirizine (XYZAL) 5 MG tablet Take 5 mg by mouth at bedtime.        . mesalamine (ASACOL) 400 MG EC tablet Take 400 mg by mouth. Pt takes a different version of  This med      . metoprolol (TOPROL-XL) 50 MG 24 hr tablet Take 50 mg by mouth daily.        . mometasone (ELOCON) 0.1 % cream As needed      . NITROSTAT 0.4 MG SL tablet DISSOLVE 1 TAB UNDER TONGUE EVERY 5 MIN AS NEEDED FOR CHEST PAIN MAY REPEAT 3 TIMES  25 tablet  2  . PATANASE 0.6 % SOLN For allergies      . PREVIDENT 5000 BOOSTER PLUS 1.1 % PSTE toothpaste      . PROCTOSOL HC 2.5 % rectal cream If needed        Allergies  Allergen Reactions  . Chlorthalidone     REACTION: dizzy  . Clopidogrel Bisulfate     REACTION: leg pain and fluid on knee  . Dicyclomine     REACTION: dizzy/bloated  . Doxycycline     REACTION: dizzy  . Metformin     REACTION: headache  .  Niacin     REACTION: rash    History   Social History  . Marital Status: Divorced    Spouse Name: N/A    Number of Children: N/A  . Years of Education: N/A   Occupational History  . Not on file.   Social History Main Topics  . Smoking status: Former Smoker    Quit date: 06/24/1989  . Smokeless tobacco: Not on file  . Alcohol Use: No  . Drug Use: No  . Sexually Active: Not on file   Other Topics Concern  . Not on file   Social History Narrative   Divorced, 2 children. Smoked 1-2ppd for 20 years but stopped in 1999.     No family history on file.  Review of Systems:  As stated in the HPI and otherwise negative.   BP 150/90  Pulse 68  Ht 5\' 7"  (1.702 m)  Wt 223 lb (101.152 kg)  BMI 34.93 kg/m2  Physical Examination: General: Well developed, well nourished, NAD HEENT: OP clear, mucus membranes moist SKIN: warm, dry. No rashes. Neuro: No focal deficits Musculoskeletal: Muscle strength 5/5 all ext Psychiatric: Mood and affect normal Neck: No JVD, no carotid bruits, no thyromegaly, no lymphadenopathy. Lungs:Clear bilaterally, no  wheezes, rhonci, crackles Cardiovascular: Regular rate and rhythm. No murmurs, gallops or rubs. Abdomen:Soft. Bowel sounds present. Non-tender.  Extremities: No lower extremity edema. Pulses are 2 + in the bilateral DP/PT.  EKG: NSR, rate 71 bpm. Normal EKG

## 2011-11-25 NOTE — Assessment & Plan Note (Addendum)
He has known multi-vessel disease with prior stents. He is having chest pains that are exertional and at rest. He has so many GI issues that it is hard to sort out his cardiac pain from his GI related complaints. Will arrange exercise stress myoview to exclude coronary ischemia. Continue current meds. He does not tolerate statins.

## 2011-12-10 ENCOUNTER — Encounter (HOSPITAL_COMMUNITY): Payer: 59

## 2011-12-18 ENCOUNTER — Ambulatory Visit (HOSPITAL_COMMUNITY): Payer: 59 | Attending: Cardiovascular Disease | Admitting: Radiology

## 2011-12-18 VITALS — BP 149/85 | Ht 67.0 in | Wt 219.0 lb

## 2011-12-18 DIAGNOSIS — E119 Type 2 diabetes mellitus without complications: Secondary | ICD-10-CM | POA: Insufficient documentation

## 2011-12-18 DIAGNOSIS — Z87891 Personal history of nicotine dependence: Secondary | ICD-10-CM | POA: Insufficient documentation

## 2011-12-18 DIAGNOSIS — R079 Chest pain, unspecified: Secondary | ICD-10-CM

## 2011-12-18 DIAGNOSIS — E785 Hyperlipidemia, unspecified: Secondary | ICD-10-CM

## 2011-12-18 DIAGNOSIS — I10 Essential (primary) hypertension: Secondary | ICD-10-CM

## 2011-12-18 DIAGNOSIS — R5381 Other malaise: Secondary | ICD-10-CM | POA: Insufficient documentation

## 2011-12-18 DIAGNOSIS — I2510 Atherosclerotic heart disease of native coronary artery without angina pectoris: Secondary | ICD-10-CM

## 2011-12-18 MED ORDER — TECHNETIUM TC 99M TETROFOSMIN IV KIT
10.0000 | PACK | Freq: Once | INTRAVENOUS | Status: AC | PRN
  Administered 2011-12-18: 10 via INTRAVENOUS

## 2011-12-18 MED ORDER — TECHNETIUM TC 99M TETROFOSMIN IV KIT
30.0000 | PACK | Freq: Once | INTRAVENOUS | Status: AC | PRN
  Administered 2011-12-18: 30 via INTRAVENOUS

## 2011-12-18 NOTE — Progress Notes (Signed)
[facility] SITE 3 NUCLEAR MED [address] [place] [address] [phone]  Cardiology Nuclear Med Study  John Cuevas is a 61 y.o. male     MRN : [id number]     DOB: 12/21/1950  Procedure Date: 12/18/2011  Nuclear Med Background Indication for Stress Test:  Evaluation for Ischemia and Stent Patency History:  01/2008: MPS: inferobasilar infarct mild-mod inferolateral ischemia mixed fixed anteroseptal defect EF: 62%--Stent: RCA, 07/2009 Heart Cath: Triple vessel Dz stents x2 patent RCA stent EF: 65%, ECHO: septal and inferior apical ischemia-Stents: LAD and OM Cardiac Risk Factors: History of Smoking, Hypertension, Lipids and NIDDM  Symptoms:  Chest Pain, Chest Pain with Exertion and Fatigue   Nuclear Pre-Procedure Caffeine/Decaff Intake:  None NPO After: 8:00pm   Lungs:  clear O2 Sat: 96% on room air. IV 0.9% NS with Angio Cath:  20g  IV Site: R Hand  IV Started by:  Cathlyn Parsons, RN  Chest Size (in):  48 Cup Size: n/a  Height: 5\' 7"  (1.702 m)  Weight:  219 lb (99.338 kg)  BMI:  Body mass index is 34.30 kg/(m^2). Tech Comments:  Metoprolol held x 24 hrs    Nuclear Med Study 1 or 2 day study: 1 day  Stress Test Type:  Stress  Reading MD: Arvilla Meres, MD  Order Authorizing Provider:  Tedra Senegal  Resting Radionuclide: Technetium 47m Tetrofosmin  Resting Radionuclide Dose: 11.0 mCi   Stress Radionuclide:  Technetium 68m Tetrofosmin  Stress Radionuclide Dose: 31.9 mCi           Stress Protocol Rest HR: 64 Stress HR: 142  Rest BP: 149/85 Stress BP: 207/80  Exercise Time (min): 9:00 METS: 10.10   Predicted Max HR: 157 bpm % Max HR: 90.45 bpm Rate Pressure Product: 46962   Dose of Adenosine (mg):  n/a Dose of Lexiscan: n/a mg  Dose of Atropine (mg): n/a Dose of Dobutamine: n/a mcg/kg/min (at max HR)  Stress Test Technologist: Milana Na, EMT-P  Nuclear Technologist:  Doyne Keel, CNMT     Rest Procedure:  Myocardial  perfusion imaging was performed at rest 45 minutes following the intravenous administration of Technetium 69m Tetrofosmin. Rest ECG: NSR - Normal EKG  Stress Procedure:  The patient performed treadmill exercise using a Bruce  Protocol for 9:00 minutes. The patient stopped due to fatigue and denied any chest pain.  There were + significant ST-T wave changes.  Technetium 61m Tetrofosmin was injected at peak exercise and myocardial perfusion imaging was performed after a brief delay. Stress ECG: Nondiagnostic 0.5-63mm ST depression laterally with stress.  QPS Raw Data Images:  Normal; no motion artifact; normal heart/lung ratio. Stress Images:  Normal homogeneous uptake in all areas of the myocardium. Rest Images:  Normal homogeneous uptake in all areas of the myocardium. Subtraction (SDS):  Normal Transient Ischemic Dilatation (Normal <1.22):  0.88 Lung/Heart Ratio (Normal <0.45):  0.29  Quantitative Gated Spect Images QGS EDV:  91 ml QGS ESV:  36 ml  Impression Exercise Capacity:  Good exercise capacity. BP Response:  Hypertensive blood pressure response. Clinical Symptoms:  No significant symptoms noted. ECG Impression:  Nondiagnostic 0.5-69mm ST depression laterally with stress. Comparison with Prior Nuclear Study: No images to compare  Overall Impression:  Normal stress nuclear study.  LV Ejection Fraction: 61%.  LV Wall Motion:  NL LV Function; NL Wall Motion   [name] 6:28 PM

## 2011-12-23 ENCOUNTER — Telehealth: Payer: Self-pay | Admitting: Cardiovascular Disease

## 2011-12-24 NOTE — Telephone Encounter (Signed)
error 

## 2012-02-28 NOTE — Addendum Note (Signed)
Addended by: Burnett Kanaris A on: 02/28/2012 03:09 PM   Modules accepted: Orders

## 2012-02-28 NOTE — Addendum Note (Signed)
Addended by: Burnett Kanaris A on: 02/28/2012 03:01 PM   Modules accepted: Orders

## 2012-06-02 ENCOUNTER — Encounter: Payer: Self-pay | Admitting: Cardiovascular Disease

## 2012-06-02 ENCOUNTER — Ambulatory Visit (INDEPENDENT_AMBULATORY_CARE_PROVIDER_SITE_OTHER): Payer: 59 | Admitting: Cardiovascular Disease

## 2012-06-02 VITALS — BP 137/75 | HR 79 | Resp 12

## 2012-06-02 DIAGNOSIS — E785 Hyperlipidemia, unspecified: Secondary | ICD-10-CM

## 2012-06-02 DIAGNOSIS — I10 Essential (primary) hypertension: Secondary | ICD-10-CM

## 2012-06-02 DIAGNOSIS — I2510 Atherosclerotic heart disease of native coronary artery without angina pectoris: Secondary | ICD-10-CM

## 2012-06-02 MED ORDER — METOPROLOL SUCCINATE ER 50 MG PO TB24: 50.0000 mg | ORAL_TABLET | Freq: Every day | ORAL | Status: DC

## 2012-06-02 MED ORDER — NITROGLYCERIN 0.4 MG SL SUBL: 0.4000 mg | SUBLINGUAL_TABLET | SUBLINGUAL | Status: DC | PRN

## 2012-06-02 NOTE — Patient Instructions (Addendum)
Your physician wants you to follow-up in:  6 months. You will receive a reminder letter in the mail two months in advance. If you don't receive a letter, please call our office to schedule the follow-up appointment.   

## 2012-06-02 NOTE — Progress Notes (Signed)
History of Present Illness: 61 yo WM with h/o HTN, Hyperlipidemia, Crohns disease, DM, hiatal hernia and CAD diagnosed by cath August 2009 here for follow up. Pt had DES placed in proximal RCA August 2009 and drug eluting stents in the LAD and the OM in February 2011. His main problem over the last year has been GI related. He has been seen by [facility] Dr. Ewing Schlein but asked to be referred to [facility]. We made that referral but they did not agree to see him. He has now switched his care to Dr. Merri Brunette at [facility] in the GI Department. His GI issues are better since he has been treated there.   He is here today for follow up. He is doing well. He has been active. He has been off of a statin because of joint aches. Stress myoview June 2013 with no evidence of ischemia.   Primary Care Physician: Herb Grays  Last Lipid Profile:Lipid Panel     Component Value Date/Time   CHOL 174 11/25/2011 1009   TRIG 118.0 11/25/2011 1009   HDL 34.50* 11/25/2011 1009   CHOLHDL 5 11/25/2011 1009   VLDL 23.6 11/25/2011 1009   LDLCALC 116* 11/25/2011 1009     Past Medical History  Diagnosis Date  . Diabetes mellitus, type 2   . Hypertension   . Hiatal hernia   . Crohn's disease   . GERD (gastroesophageal reflux disease)   . Coronary artery disease     DES placed in proximal RCA August 2009 and drug eluting stents in the LAD and the OM in February 2011    Past Surgical History  Procedure Date  . Cholecystectomy   . Laporoscopic knee surgery   . Carpal tunnel release     Current Outpatient Prescriptions  Medication Sig Dispense Refill  . aspirin (ASPIR-81) 81 MG EC tablet Take 81 mg by mouth daily.        . clobetasol cream (TEMOVATE) 0.05 % As needed      . esomeprazole (NEXIUM) 40 MG capsule Take 40 mg by mouth daily.        . fluticasone (FLONASE) 50 MCG/ACT nasal spray As  needed      . levocetirizine (XYZAL) 5 MG tablet Take 5 mg by mouth at bedtime.        . mesalamine (ASACOL) 400  MG EC tablet Take 400 mg by mouth. Pt takes a different version of  This med      . metoprolol (TOPROL-XL) 50 MG 24 hr tablet Take 50 mg by mouth daily.        . mometasone (ELOCON) 0.1 % cream As needed      . NITROSTAT 0.4 MG SL tablet DISSOLVE 1 TAB UNDER TONGUE EVERY 5 MIN AS NEEDED FOR CHEST PAIN MAY REPEAT 3 TIMES  25 tablet  2  . PATANASE 0.6 % SOLN For allergies      . PREVIDENT 5000 BOOSTER PLUS 1.1 % PSTE toothpaste      . PROCTOSOL HC 2.5 % rectal cream If needed        Allergies  Allergen Reactions  . Chlorthalidone     REACTION: dizzy  . Clopidogrel Bisulfate     REACTION: leg pain and fluid on knee  . Dicyclomine     REACTION: dizzy/bloated  . Doxycycline     REACTION: dizzy  . Metformin     REACTION: headache  . Niacin     REACTION: rash  History   Social History  . Marital Status: Divorced    Spouse Name: N/A    Number of Children: N/A  . Years of Education: N/A   Occupational History  . Not on file.   Social History Main Topics  . Smoking status: Former Smoker    Quit date: 06/24/1989  . Smokeless tobacco: Not on file  . Alcohol Use: No  . Drug Use: No  . Sexually Active: Not on file   Other Topics Concern  . Not on file   Social History Narrative   Divorced, 2 children. Smoked 1-2ppd for 20 years but stopped in 1999.     No family history on file.  Review of Systems:  As stated in the HPI and otherwise negative.   BP 137/75  Pulse 79  Resp 12  Physical Examination: General: Well developed, well nourished, NAD HEENT: OP clear, mucus membranes moist SKIN: warm, dry. No rashes. Neuro: No focal deficits Musculoskeletal: Muscle strength 5/5 all ext Psychiatric: Mood and affect normal Neck: No JVD, no carotid bruits, no thyromegaly, no lymphadenopathy. Lungs:Clear bilaterally, no wheezes, rhonci, crackles Cardiovascular: Regular rate and rhythm. No murmurs, gallops or rubs. Abdomen:Soft. Bowel sounds present. Non-tender.    Extremities: No lower extremity edema. Pulses are 2 + in the bilateral DP/PT.   Assessment and Plan:   1. CAD: He has known multi-vessel disease with prior stents. Stress test normal June 2013. Continue current meds. He does not tolerate statins.   2. HYPERTENSION: BP well controlled.   3. HYPERLIPIDEMIA:  He has not been on a statin secondary to muscle aches.

## 2012-12-15 ENCOUNTER — Ambulatory Visit (INDEPENDENT_AMBULATORY_CARE_PROVIDER_SITE_OTHER): Payer: 59 | Admitting: Cardiovascular Disease

## 2012-12-15 ENCOUNTER — Encounter: Payer: Self-pay | Admitting: Cardiovascular Disease

## 2012-12-15 VITALS — BP 134/88 | HR 73 | Ht 67.0 in | Wt 206.0 lb

## 2012-12-15 DIAGNOSIS — E785 Hyperlipidemia, unspecified: Secondary | ICD-10-CM

## 2012-12-15 DIAGNOSIS — I2510 Atherosclerotic heart disease of native coronary artery without angina pectoris: Secondary | ICD-10-CM

## 2012-12-15 DIAGNOSIS — I10 Essential (primary) hypertension: Secondary | ICD-10-CM

## 2012-12-15 NOTE — Progress Notes (Signed)
History of Present Illness: 62 yo WM with h/o HTN, Hyperlipidemia, Crohns disease, DM, hiatal hernia and CAD diagnosed by cath August 2009 here for follow up. Pt had DES placed in proximal RCA August 2009 and drug eluting stents in the LAD and the OM in February 2011. His main complaint over the last few years has been his GI issues. He is being followed by Dr. Merri Brunette at [facility] in the GI Department. His GI issues have been acting up lately. Stress myoview June 2013 with no evidence of ischemia.    He is here today for follow up. He is doing well. He has been active. He has been off of a statin because of joint aches.   Primary Care Physician: Herb Grays  Last Lipid Profile:Lipid Panel     Component Value Date/Time   CHOL 174 11/25/2011 1009   TRIG 118.0 11/25/2011 1009   HDL 34.50* 11/25/2011 1009   CHOLHDL 5 11/25/2011 1009   VLDL 23.6 11/25/2011 1009   LDLCALC 116* 11/25/2011 1009     Past Medical History  Diagnosis Date  . Diabetes mellitus, type 2   . Hypertension   . Hiatal hernia   . Crohn's disease   . GERD (gastroesophageal reflux disease)   . Coronary artery disease     DES placed in proximal RCA August 2009 and drug eluting stents in the LAD and the OM in February 2011    Past Surgical History  Procedure Laterality Date  . Cholecystectomy    . Laporoscopic knee surgery    . Carpal tunnel release      Current Outpatient Prescriptions  Medication Sig Dispense Refill  . aspirin (ASPIR-81) 81 MG EC tablet Take 81 mg by mouth daily.        . clobetasol cream (TEMOVATE) 0.05 % As needed      . fluticasone (FLONASE) 50 MCG/ACT nasal spray As  needed      . metoprolol succinate (TOPROL-XL) 50 MG 24 hr tablet Take 1 tablet (50 mg total) by mouth daily.  90 tablet  3  . mometasone (ELOCON) 0.1 % cream As needed      . nitroGLYCERIN (NITROSTAT) 0.4 MG SL tablet Place 1 tablet (0.4 mg total) under the tongue every 5 (five) minutes as needed for chest pain.  25  tablet  6  . PREVIDENT 5000 BOOSTER PLUS 1.1 % PSTE toothpaste      . PROCTOSOL HC 2.5 % rectal cream If needed       No current facility-administered medications for this visit.    Allergies  Allergen Reactions  . Chlorthalidone     REACTION: dizzy  . Clopidogrel Bisulfate     REACTION: leg pain and fluid on knee  . Dicyclomine     REACTION: dizzy/bloated  . Doxycycline     REACTION: dizzy  . Metformin     REACTION: headache  . Niacin     REACTION: rash    History   Social History  . Marital Status: Divorced    Spouse Name: N/A    Number of Children: N/A  . Years of Education: N/A   Occupational History  . Not on file.   Social History Main Topics  . Smoking status: Former Smoker    Quit date: 06/24/1989  . Smokeless tobacco: Not on file  . Alcohol Use: No  . Drug Use: No  . Sexually Active: Not on file   Other Topics Concern  . Not on  file   Social History Narrative   Divorced, 2 children. Smoked 1-2ppd for 20 years but stopped in 1999.     No family history on file.  Review of Systems:  As stated in the HPI and otherwise negative.   BP 134/88  Pulse 73  Ht 5\' 7"  (1.702 m)  Wt 206 lb (93.441 kg)  BMI 32.26 kg/m2  Physical Examination: General: Well developed, well nourished, NAD HEENT: OP clear, mucus membranes moist SKIN: warm, dry. No rashes. Neuro: No focal deficits Musculoskeletal: Muscle strength 5/5 all ext Psychiatric: Mood and affect normal Neck: No JVD, no carotid bruits, no thyromegaly, no lymphadenopathy. Lungs:Clear bilaterally, no wheezes, rhonci, crackles Cardiovascular: Regular rate and rhythm. No murmurs, gallops or rubs. Abdomen:Soft. Bowel sounds present. Non-tender.  Extremities: No lower extremity edema. Pulses are 2 + in the bilateral DP/PT.  EKG: NSR, rate 73 bpm.   Assessment and Plan:   1. CAD: He has known multi-vessel disease with prior stents. Stress test normal June 2013. Continue current meds. He does not  tolerate statins. Continue ASA and beta blocker.   2. HYPERTENSION: BP well controlled.   3. HYPERLIPIDEMIA: He has not been on a statin secondary to muscle aches.

## 2012-12-15 NOTE — Patient Instructions (Addendum)
Your physician wants you to follow-up in:  6 months. You will receive a reminder letter in the mail two months in advance. If you don't receive a letter, please call our office to schedule the follow-up appointment.   

## 2012-12-29 ENCOUNTER — Emergency Department (HOSPITAL_COMMUNITY): Payer: 59

## 2012-12-29 ENCOUNTER — Emergency Department (HOSPITAL_COMMUNITY)
Admission: EM | Admit: 2012-12-29 | Discharge: 2012-12-29 | Disposition: A | Discharge status: Home / Self Care | Payer: 59 | Attending: Emergency Medicine | Admitting: Emergency Medicine

## 2012-12-29 ENCOUNTER — Encounter (HOSPITAL_COMMUNITY): Payer: Self-pay | Admitting: Emergency Medicine

## 2012-12-29 DIAGNOSIS — N201 Calculus of ureter: Secondary | ICD-10-CM

## 2012-12-29 DIAGNOSIS — H9190 Unspecified hearing loss, unspecified ear: Secondary | ICD-10-CM | POA: Insufficient documentation

## 2012-12-29 DIAGNOSIS — Z9861 Coronary angioplasty status: Secondary | ICD-10-CM | POA: Insufficient documentation

## 2012-12-29 DIAGNOSIS — M545 Low back pain, unspecified: Secondary | ICD-10-CM | POA: Insufficient documentation

## 2012-12-29 DIAGNOSIS — I2510 Atherosclerotic heart disease of native coronary artery without angina pectoris: Secondary | ICD-10-CM | POA: Insufficient documentation

## 2012-12-29 DIAGNOSIS — K5090 Crohn's disease, unspecified, without complications: Secondary | ICD-10-CM | POA: Insufficient documentation

## 2012-12-29 DIAGNOSIS — Z9089 Acquired absence of other organs: Secondary | ICD-10-CM | POA: Insufficient documentation

## 2012-12-29 DIAGNOSIS — Z79899 Other long term (current) drug therapy: Secondary | ICD-10-CM | POA: Insufficient documentation

## 2012-12-29 DIAGNOSIS — I10 Essential (primary) hypertension: Secondary | ICD-10-CM | POA: Insufficient documentation

## 2012-12-29 DIAGNOSIS — Z87891 Personal history of nicotine dependence: Secondary | ICD-10-CM | POA: Insufficient documentation

## 2012-12-29 DIAGNOSIS — E119 Type 2 diabetes mellitus without complications: Secondary | ICD-10-CM | POA: Insufficient documentation

## 2012-12-29 DIAGNOSIS — Z8719 Personal history of other diseases of the digestive system: Secondary | ICD-10-CM | POA: Insufficient documentation

## 2012-12-29 DIAGNOSIS — Z7982 Long term (current) use of aspirin: Secondary | ICD-10-CM | POA: Insufficient documentation

## 2012-12-29 LAB — URINALYSIS, ROUTINE W REFLEX MICROSCOPIC
Bilirubin Urine: NEGATIVE
Ketones, ur: NEGATIVE mg/dL
Protein, ur: NEGATIVE mg/dL
Urobilinogen, UA: 0.2 mg/dL (ref 0.0–1.0)

## 2012-12-29 LAB — CBC WITH DIFFERENTIAL/PLATELET
Eosinophils Relative: 1 % (ref 0–5)
Hemoglobin: 14.7 g/dL (ref 13.0–17.0)
Lymphocytes Relative: 23 % (ref 12–46)
Lymphs Abs: 2.5 10*3/uL (ref 0.7–4.0)
MCV: 82.2 fL (ref 78.0–100.0)
Monocytes Relative: 12 % (ref 3–12)
Platelets: 321 10*3/uL (ref 150–400)
RBC: 5.1 MIL/uL (ref 4.22–5.81)
WBC: 11.1 10*3/uL — ABNORMAL HIGH (ref 4.0–10.5)

## 2012-12-29 LAB — COMPREHENSIVE METABOLIC PANEL
ALT: 12 U/L (ref 0–53)
Alkaline Phosphatase: 70 U/L (ref 39–117)
BUN: 14 mg/dL (ref 6–23)
CO2: 24 mEq/L (ref 19–32)
Calcium: 9.1 mg/dL (ref 8.4–10.5)
GFR calc Af Amer: 69 mL/min — ABNORMAL LOW (ref >90)
GFR calc non Af Amer: 59 mL/min — ABNORMAL LOW (ref >90)
Glucose, Bld: 149 mg/dL — ABNORMAL HIGH (ref 70–99)
Potassium: 3.9 mEq/L (ref 3.5–5.1)
Sodium: 131 mEq/L — ABNORMAL LOW (ref 135–145)

## 2012-12-29 LAB — LIPASE, BLOOD: Lipase: 23 U/L (ref 11–59)

## 2012-12-29 LAB — POCT I-STAT TROPONIN I

## 2012-12-29 LAB — GLUCOSE, CAPILLARY: Glucose-Capillary: 108 mg/dL — ABNORMAL HIGH (ref 70–99)

## 2012-12-29 MED ORDER — TAMSULOSIN HCL 0.4 MG PO CAPS: 0.4000 mg | ORAL_CAPSULE | Freq: Every day | ORAL | Status: DC

## 2012-12-29 MED ORDER — MORPHINE SULFATE 4 MG/ML IJ SOLN
4.0000 mg | Freq: Once | INTRAMUSCULAR | Status: AC
  Administered 2012-12-29: 4 mg via INTRAVENOUS
  Filled 2012-12-29: qty 1

## 2012-12-29 MED ORDER — HYDROMORPHONE HCL PF 2 MG/ML IJ SOLN
2.0000 mg | Freq: Once | INTRAMUSCULAR | Status: AC
  Administered 2012-12-29: 2 mg via INTRAVENOUS
  Filled 2012-12-29: qty 1

## 2012-12-29 MED ORDER — OXYCODONE-ACETAMINOPHEN 5-325 MG PO TABS
1.0000 | ORAL_TABLET | ORAL | Status: DC | PRN
Start: 2012-12-29 — End: 2013-06-28

## 2012-12-29 MED ORDER — IOHEXOL 300 MG/ML  SOLN
100.0000 mL | Freq: Once | INTRAMUSCULAR | Status: AC | PRN
  Administered 2012-12-29: 100 mL via INTRAVENOUS

## 2012-12-29 MED ORDER — IOHEXOL 300 MG/ML  SOLN
25.0000 mL | INTRAMUSCULAR | Status: AC
  Administered 2012-12-29: 25 mL via ORAL

## 2012-12-29 MED ORDER — ONDANSETRON HCL 4 MG/2ML IJ SOLN
4.0000 mg | Freq: Once | INTRAMUSCULAR | Status: AC
  Administered 2012-12-29: 4 mg via INTRAVENOUS
  Filled 2012-12-29: qty 2

## 2012-12-29 NOTE — ED Notes (Signed)
MD at bedside. 

## 2012-12-29 NOTE — ED Notes (Signed)
Dr. Davidson at bedside.

## 2012-12-29 NOTE — ED Notes (Signed)
Pt reported to CT

## 2012-12-29 NOTE — ED Notes (Signed)
Onset 4 days ago abdominal pain, lower back pain, and right testicle pain after taking medications. Patient has a history of chron's and currently having multiple episodes of diarrhea.

## 2012-12-29 NOTE — ED Notes (Signed)
Pt demanding a meal tray as he is "starving to death".  RN advised pt that md must decide if he can eat.  Pt demanding to know what the plan of care is, when he will be going home

## 2012-12-29 NOTE — ED Provider Notes (Signed)
History    CSN: [id number] Arrival date & time 12/29/12  1032  First MD Initiated Contact with Patient 12/29/12 1047     Chief Complaint  Patient presents with  . Abdominal Pain  . Back Pain   (Consider location/radiation/quality/duration/timing/severity/associated sxs/prior Treatment) Patient is a 62 y.o. male presenting with abdominal pain. The history is provided by the patient and medical records. No language interpreter was used.  Abdominal Pain This is a recurrent (Patient is a 62 year old man with known Crohn's disease. Last night he had worsening pain in the right lower abdomen. He has taken [facility] a call without relief. He saw Maxwell Marion M.D. this morning, who advised ED evaluation.) problem. The current episode started 12 to 24 hours ago. The problem occurs constantly. The problem has been gradually worsening. Associated symptoms include abdominal pain. Pertinent negatives include no chest pain, no headaches and no shortness of breath. Nothing aggravates the symptoms. Nothing relieves the symptoms. Treatments tried: He has taken Asacol HD without relief.   Past Medical History  Diagnosis Date  . Diabetes mellitus, type 2   . Hypertension   . Hiatal hernia   . Crohn's disease   . GERD (gastroesophageal reflux disease)   . Coronary artery disease     DES placed in proximal RCA August 2009 and drug eluting stents in the LAD and the OM in February 2011   Past Surgical History  Procedure Laterality Date  . Cholecystectomy    . Laporoscopic knee surgery    . Carpal tunnel release     No family history on file. History  Substance Use Topics  . Smoking status: Former Smoker    Quit date: 06/24/1989  . Smokeless tobacco: Not on file  . Alcohol Use: No    Review of Systems  Constitutional: Negative for fever and chills.  HENT: Positive for hearing loss. 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   Eyes: Negative.    Respiratory: Negative.  Negative for shortness of breath.   Cardiovascular: Negative for chest pain.       He has a history of coronary artery disease, and has had 2 episodes requiring stents, and 209 and 2011.  Gastrointestinal: Positive for abdominal pain.       Known history of Crohn's disease.  Genitourinary: Negative.   Musculoskeletal: Positive for back pain.  Skin: Negative.   Neurological: Negative for headaches.  Psychiatric/Behavioral: Negative.     Allergies  Chlorthalidone; Clopidogrel bisulfate; Dicyclomine; Doxycycline; Metformin; and Niacin  Home Medications   Current Outpatient Rx  Name  Route  Sig  Dispense  Refill  . aspirin (ASPIR-81) 81 MG EC tablet   Oral   Take 81 mg by mouth daily.           . clobetasol cream (TEMOVATE) 0.05 %      As needed         . EPINEPHrine (EPI-PEN) 0.3 mg/0.3 mL SOAJ   Intramuscular   Inject 0.3 mg into the muscle once as needed. For bee stings         . fluticasone (FLONASE) 50 MCG/ACT nasal spray      As  needed         . Mesalamine (ASACOL HD) 800 MG TBEC   Oral   Take 1 tablet by mouth 3 (three) times daily.         . metoprolol succinate (TOPROL-XL) 50 MG 24 hr tablet   Oral  Take 1 tablet (50 mg total) by mouth daily.   90 tablet   3     Pt requests medication be refilled on January 1, 2 ...   . mometasone (ELOCON) 0.1 % cream      As needed         . PREVIDENT 5000 BOOSTER PLUS 1.1 % PSTE      toothpaste         . nitroGLYCERIN (NITROSTAT) 0.4 MG SL tablet   Sublingual   Place 1 tablet (0.4 mg total) under the tongue every 5 (five) minutes as needed for chest pain.   25 tablet   6    BP 146/79  Pulse 86  Temp(Src) 97.9 F (36.6 C) (Oral)  Resp 20  SpO2 98% Physical Exam  Nursing note and vitals reviewed. Constitutional: He is oriented to person, place, and time. He appears well-developed and well-nourished. Distressed: in moderate distress with right lower quadrant abdominal  pain.  HENT:  Head: Normocephalic and atraumatic.  Mouth/Throat: Oropharynx is clear and moist.  Hearing aids in both the ears.  Eyes: Conjunctivae and EOM are normal. Pupils are equal, round, and reactive to light.  Neck: Normal range of motion. Neck supple.  Cardiovascular: Normal rate, regular rhythm and normal heart sounds.   Pulmonary/Chest: Effort normal and breath sounds normal. No respiratory distress. He has no wheezes. He has no rales.  Abdominal: Soft.  He has mild bloating. He localizes his pain to the right lower abdomen, with mild diffuse tenderness there.  Musculoskeletal: Normal range of motion. He exhibits no edema and no tenderness.  Neurological: He is alert and oriented to person, place, and time.  No sensory or motor deficit.    ED Course  Procedures (including critical care time) Labs Reviewed  CBC WITH DIFFERENTIAL  COMPREHENSIVE METABOLIC PANEL  LIPASE, BLOOD  URINALYSIS, ROUTINE W REFLEX MICROSCOPIC   11:53 AM  Date: 12/29/2012  Rate:73  Rhythm: normal sinus rhythm  QRS Axis: normal  Intervals: PR prolonged  ST/T Wave abnormalities: normal  Conduction Disutrbances:first-degree A-V block   Narrative Interpretation: Borderline EKG  Old EKG Reviewed: unchanged  12:16 PM Minimal relief of pain with Morphine 4 mg IV.  Will increase to Dilaudid 2 mg IV.    1:21 PM Pain better.  Has finished drinking oral contrast, waiting for abdominal/pelvic CT.  Results for orders placed during the hospital encounter of 12/29/12  CBC WITH DIFFERENTIAL      Result Value Range   WBC 11.1 (*) 4.0 - 10.5 K/uL   RBC 5.10  4.22 - 5.81 MIL/uL   Hemoglobin 14.7  13.0 - 17.0 g/dL   HCT 49.7  02.6 - 37.8 %   MCV 82.2  78.0 - 100.0 fL   MCH 28.8  26.0 - 34.0 pg   MCHC 35.1  30.0 - 36.0 g/dL   RDW 58.8  50.2 - 77.4 %   Platelets 321  150 - 400 K/uL   Neutrophils Relative % 65  43 - 77 %   Neutro Abs 7.2  1.7 - 7.7 K/uL   Lymphocytes Relative 23  12 - 46 %   Lymphs Abs  2.5  0.7 - 4.0 K/uL   Monocytes Relative 12  3 - 12 %   Monocytes Absolute 1.3 (*) 0.1 - 1.0 K/uL   Eosinophils Relative 1  0 - 5 %   Eosinophils Absolute 0.1  0.0 - 0.7 K/uL   Basophils Relative 0  0 - 1 %  Basophils Absolute 0.0  0.0 - 0.1 K/uL  COMPREHENSIVE METABOLIC PANEL      Result Value Range   Sodium 131 (*) 135 - 145 mEq/L   Potassium 3.9  3.5 - 5.1 mEq/L   Chloride 96  96 - 112 mEq/L   CO2 24  19 - 32 mEq/L   Glucose, Bld 149 (*) 70 - 99 mg/dL   BUN 14  6 - 23 mg/dL   Creatinine, Ser 1.61  0.50 - 1.35 mg/dL   Calcium 9.1  8.4 - 09.6 mg/dL   Total Protein 7.1  6.0 - 8.3 g/dL   Albumin 3.2 (*) 3.5 - 5.2 g/dL   AST 13  0 - 37 U/L   ALT 12  0 - 53 U/L   Alkaline Phosphatase 70  39 - 117 U/L   Total Bilirubin 0.5  0.3 - 1.2 mg/dL   GFR calc non Af Amer 59 (*) >90 mL/min   GFR calc Af Amer 69 (*) >90 mL/min  LIPASE, BLOOD      Result Value Range   Lipase 23  11 - 59 U/L  URINALYSIS, ROUTINE W REFLEX MICROSCOPIC      Result Value Range   Color, Urine YELLOW  YELLOW   APPearance CLEAR  CLEAR   Specific Gravity, Urine 1.005  1.005 - 1.030   pH 6.0  5.0 - 8.0   Glucose, UA NEGATIVE  NEGATIVE mg/dL   Hgb urine dipstick SMALL (*) NEGATIVE   Bilirubin Urine NEGATIVE  NEGATIVE   Ketones, ur NEGATIVE  NEGATIVE mg/dL   Protein, ur NEGATIVE  NEGATIVE mg/dL   Urobilinogen, UA 0.2  0.0 - 1.0 mg/dL   Nitrite NEGATIVE  NEGATIVE   Leukocytes, UA NEGATIVE  NEGATIVE  URINE MICROSCOPIC-ADD ON      Result Value Range   Squamous Epithelial / LPF RARE  RARE   WBC, UA 0-2  <3 WBC/hpf   RBC / HPF 0-2  <3 RBC/hpf   Bacteria, UA RARE  RARE  GLUCOSE, CAPILLARY      Result Value Range   Glucose-Capillary 108 (*) 70 - 99 mg/dL  POCT I-STAT TROPONIN I      Result Value Range   Troponin i, poc 0.01  0.00 - 0.08 ng/mL   Comment 3            Ct Abdomen Pelvis W Contrast  12/29/2012   *RADIOLOGY REPORT*  Clinical Data: Lower back pain, right testicle pain  CT ABDOMEN AND PELVIS WITH  CONTRAST  Technique:  Multidetector CT imaging of the abdomen and pelvis was performed following the standard protocol during bolus administration of intravenous contrast.  Contrast: OMNIPAQUE IOHEXOL 300 MG/ML  SOLN  Comparison: 10/09/2007  Findings: The sagittal images of the spine shows multilevel degenerative changes thoracolumbar spine.  Lung bases are unremarkable.  Enhanced liver is unremarkable.  The patient is status post cholecystectomy.  Enhanced pancreas and adrenal glands are unremarkable.  The spleen is unremarkable. There is less enhancement of the right kidney.  There is mild right hydronephrosis and right hydroureter.  Mild right perinephric stranding.  Findings are consistent with obstructive uropathy.  In axial image 86 there is calcified obstructive calculus in the right UVJ/urinary bladder wall measures 9 x 6.5 mm.  There is mild diffuse thickening of the colonic wall.  Findings are consistent with chronic colitis or inflammatory bowel disease.  No pericolonic abscess or extraluminal contrast material is noted. Mild thickening of the terminal ileal wall.  No  small bowel or colonic obstruction.  Small nonspecific mesenteric lymph nodes are noted probable reactive in nature.  The largest in axial image 59 measures 9 x  6 mm.  Delayed renal images shows delayed excretion and delayed washout of the contrast from the right kidney.  Nonobstructive calcified calculus in the upper pole of the right kidney measures 5.5 mm. The visualized proximal left ureter is unremarkable.  There are atherosclerotic calcifications of the abdominal aorta and the iliac arteries.  No aortic aneurysm.  IMPRESSION:  1.  Mild right hydronephrosis and right hydroureter.  Mild right perinephric stranding. 2.  There is a nonobstructive calcified calculus in the upper pole of the right kidney measures 5.5 mm. 3.  There is a calcified obstructive calculus in the right urinary bladder wall/right UVJ measures 9 x 6.5 mm. 4.   Diffuse mild thickening of the colonic wall probable due to chronic colitis or inflammatory bowel disease.  Mild thickening of the terminal ileal wall.  No small bowel or colonic obstruction.   Original Report Authenticated By: Natasha Mead, M.D.    Lab workup unremarkable.  CT of abdomen/pelvis shows 9 x 6 mm right UVJ stone with associated right hydronephrosis.  Also shows inflammatory disease of colon.  Will call Urology --> advised that pt could safely go home and have office followup tomorrow.  He was prescribed Percocet for pain, Flomax to facilitate stone passage, was given a strainer to try to catch the stone. He is to call [facility] early tomorrow morning to arrange to be seen tomorrow.    1. Right ureteral calculus    \           Carleene Cooper III, MD 12/29/12 2056

## 2013-06-16 ENCOUNTER — Ambulatory Visit: Payer: 59 | Admitting: Cardiovascular Disease

## 2013-06-28 ENCOUNTER — Encounter: Payer: Self-pay | Admitting: Cardiovascular Disease

## 2013-06-28 ENCOUNTER — Ambulatory Visit (INDEPENDENT_AMBULATORY_CARE_PROVIDER_SITE_OTHER): Payer: 59 | Admitting: Cardiovascular Disease

## 2013-06-28 VITALS — BP 126/82 | HR 76 | Ht 67.0 in | Wt 206.0 lb

## 2013-06-28 DIAGNOSIS — I2510 Atherosclerotic heart disease of native coronary artery without angina pectoris: Secondary | ICD-10-CM

## 2013-06-28 DIAGNOSIS — I10 Essential (primary) hypertension: Secondary | ICD-10-CM

## 2013-06-28 DIAGNOSIS — E785 Hyperlipidemia, unspecified: Secondary | ICD-10-CM

## 2013-06-28 MED ORDER — NITROGLYCERIN 0.4 MG SL SUBL: 0.4000 mg | SUBLINGUAL_TABLET | SUBLINGUAL | Status: DC | PRN

## 2013-06-28 NOTE — Progress Notes (Signed)
History of Present Illness: 63 yo WM with h/o HTN, Hyperlipidemia, Crohns disease, DM, hiatal hernia and CAD diagnosed by cath August 2009 here for follow up. Pt had DES placed in proximal RCA August 2009 and drug eluting stents in the LAD and the OM in February 2011. His main complaint over the last few years has been his GI issues. He is being followed by Dr. Merri [facility] at [facility] in the GI Department. Stress myoview June 2013 with no evidence of ischemia.    He is here today for follow up. He is doing well. He has been active. He has been off of a statin because of joint aches.   Primary Care Physician: John Cuevas  Last Lipid Profile:Lipid Panel     Component Value Date/Time   CHOL 174 11/25/2011 1009   TRIG 118.0 11/25/2011 1009   HDL 34.50* 11/25/2011 1009   CHOLHDL 5 11/25/2011 1009   VLDL 23.6 11/25/2011 1009   LDLCALC 116* 11/25/2011 1009     Past Medical History  Diagnosis Date  . Diabetes mellitus, type 2   . Hypertension   . Hiatal hernia   . Crohn's disease   . GERD (gastroesophageal reflux disease)   . Coronary artery disease     DES placed in proximal RCA August 2009 and drug eluting stents in the LAD and the OM in February 2011    Past Surgical History  Procedure Laterality Date  . Cholecystectomy    . Laporoscopic knee surgery    . Carpal tunnel release      Current Outpatient Prescriptions  Medication Sig Dispense Refill  . aspirin (ASPIR-81) 81 MG EC tablet Take 81 mg by mouth daily.        . clobetasol cream (TEMOVATE) 0.05 % As needed      . EPINEPHrine (EPI-PEN) 0.3 mg/0.3 mL SOAJ Inject 0.3 mg into the muscle once as needed. For bee stings      . fluticasone (FLONASE) 50 MCG/ACT nasal spray As  needed      . Mesalamine (ASACOL HD) 800 MG TBEC Take 1 tablet by mouth 3 (three) times daily.      . metoprolol succinate (TOPROL-XL) 50 MG 24 hr tablet Take 75 mg by mouth daily.      . mometasone (ELOCON) 0.1 % cream As needed      . NEXIUM 40 MG  capsule Take 1 capsule by mouth daily.      . nitroGLYCERIN (NITROSTAT) 0.4 MG SL tablet Place 1 tablet (0.4 mg total) under the tongue every 5 (five) minutes as needed for chest pain.  25 tablet  6  . PREVIDENT 5000 BOOSTER PLUS 1.1 % PSTE toothpaste       No current facility-administered medications for this visit.    Allergies  Allergen Reactions  . Chlorthalidone     REACTION: dizzy  . Clopidogrel Bisulfate     REACTION: leg pain and fluid on knee  . Dicyclomine     REACTION: dizzy/bloated  . Doxycycline     REACTION: dizzy  . Metformin     REACTION: headache  . Niacin     REACTION: rash    History   Social History  . Marital Status: Divorced    Spouse Name: N/A    Number of Children: N/A  . Years of Education: N/A   Occupational History  . Not on file.   Social History Main Topics  . Smoking status: Former Engineer, civil (consulting)moker    Quit  date: 06/24/1989  . Smokeless tobacco: Not on file  . Alcohol Use: No  . Drug Use: No  . Sexual Activity: Not on file   Other Topics Concern  . Not on file   Social History Narrative   Divorced, 2 children. Smoked 1-2ppd for 20 years but stopped in 1999.     No family history on file.  Review of Systems:  As stated in the HPI and otherwise negative.   BP 126/82  Pulse 76  Ht 5\' 7"  (1.702 m)  Wt 206 lb (93.441 kg)  BMI 32.26 kg/m2  Physical Examination: General: Well developed, well nourished, NAD HEENT: OP clear, mucus membranes moist SKIN: warm, dry. No rashes. Neuro: No focal deficits Musculoskeletal: Muscle strength 5/5 all ext Psychiatric: Mood and affect normal Neck: No JVD, no carotid bruits, no thyromegaly, no lymphadenopathy. Lungs:Clear bilaterally, no wheezes, rhonci, crackles Cardiovascular: Regular rate and rhythm. No murmurs, gallops or rubs. Abdomen:Soft. Bowel sounds present. Non-tender.  Extremities: No lower extremity edema. Pulses are 2 + in the bilateral DP/PT  Assessment and Plan:   1. CAD: He has  known multi-vessel disease with prior stents. Stress test normal June 2013. Continue current meds. He does not tolerate statins. Continue ASA and beta blocker. I discussed dual anti-platelet therapy given recent findings of DAPT trial but he refuses Plavix and Effient/Brilinta due to intolerance in past.   2. HYPERTENSION: BP well controlled.   3. HYPERLIPIDEMIA: He has not been on a statin secondary to muscle aches.

## 2013-06-28 NOTE — Patient Instructions (Signed)
Your physician wants you to follow-up in:  6 months. You will receive a reminder letter in the mail two months in advance. If you don't receive a letter, please call our office to schedule the follow-up appointment.   

## 2013-12-29 ENCOUNTER — Ambulatory Visit (INDEPENDENT_AMBULATORY_CARE_PROVIDER_SITE_OTHER): Payer: MEDICARE | Admitting: Cardiovascular Disease

## 2013-12-29 ENCOUNTER — Encounter: Payer: Self-pay | Admitting: Cardiovascular Disease

## 2013-12-29 VITALS — BP 138/80 | HR 76 | Ht 67.0 in | Wt 220.0 lb

## 2013-12-29 DIAGNOSIS — I10 Essential (primary) hypertension: Secondary | ICD-10-CM

## 2013-12-29 DIAGNOSIS — E785 Hyperlipidemia, unspecified: Secondary | ICD-10-CM

## 2013-12-29 DIAGNOSIS — I2510 Atherosclerotic heart disease of native coronary artery without angina pectoris: Secondary | ICD-10-CM

## 2013-12-29 MED ORDER — METOPROLOL SUCCINATE ER 50 MG PO TB24: 50.0000 mg | ORAL_TABLET | Freq: Every day | ORAL | Status: DC

## 2013-12-29 NOTE — Progress Notes (Signed)
History of Present Illness: 63 yo WM with h/o HTN, Hyperlipidemia, Crohns disease, DM, hiatal hernia and CAD diagnosed by cath August 2009 here for follow up. Pt had DES placed in proximal RCA August 2009 and drug eluting stents in the LAD and the OM in February 2011. His main complaint over the last few years has been his GI issues. He has been seen by local GI specialists and also at [facility] in the GI Department. Now followed in primary care by Dr. Collins Scotland and has been better on Asacol. Stress myoview June 2013 with no evidence of ischemia.    He is here today for follow up. He is doing well. He has occasional chest burning after meals. He has been active. No exertional chest pain. He has been off of a statin because of joint aches.   Primary Care Physician: Herb Grays  Last Lipid Profile:Lipid Panel     Component Value Date/Time   CHOL 174 11/25/2011 1009   TRIG 118.0 11/25/2011 1009   HDL 34.50* 11/25/2011 1009   CHOLHDL 5 11/25/2011 1009   VLDL 23.6 11/25/2011 1009   LDLCALC 116* 11/25/2011 1009     Past Medical History  Diagnosis Date  . Diabetes mellitus, type 2   . Hypertension   . Hiatal hernia   . Crohn's disease   . GERD (gastroesophageal reflux disease)   . Coronary artery disease     DES placed in proximal RCA August 2009 and drug eluting stents in the LAD and the OM in February 2011    Past Surgical History  Procedure Laterality Date  . Cholecystectomy    . Laporoscopic knee surgery    . Carpal tunnel release      Current Outpatient Prescriptions  Medication Sig Dispense Refill  . aspirin (ASPIR-81) 81 MG EC tablet Take 81 mg by mouth daily.        . clobetasol cream (TEMOVATE) 0.05 % As needed      . fluticasone (FLONASE) 50 MCG/ACT nasal spray As  needed      . Mesalamine (ASACOL HD) 800 MG TBEC Take 1 tablet by mouth 2 (two) times daily.       . metoprolol succinate (TOPROL-XL) 50 MG 24 hr tablet Take 75 mg by mouth daily.      . mometasone  (ELOCON) 0.1 % cream As needed      . NEXIUM 40 MG capsule Take 1 capsule by mouth as needed.       . nitroGLYCERIN (NITROSTAT) 0.4 MG SL tablet Place 1 tablet (0.4 mg total) under the tongue every 5 (five) minutes as needed for chest pain.  25 tablet  6   No current facility-administered medications for this visit.    Allergies  Allergen Reactions  . Chlorthalidone     REACTION: dizzy  . Clopidogrel Bisulfate     REACTION: leg pain and fluid on knee  . Dicyclomine     REACTION: dizzy/bloated  . Doxycycline     REACTION: dizzy  . Metformin     REACTION: headache  . Niacin     REACTION: rash    History   Social History  . Marital Status: Divorced    Spouse Name: N/A    Number of Children: N/A  . Years of Education: N/A   Occupational History  . Not on file.   Social History Main Topics  . Smoking status: Former Smoker    Quit date: 06/24/1989  . Smokeless tobacco:  Not on file  . Alcohol Use: No  . Drug Use: No  . Sexual Activity: Not on file   Other Topics Concern  . Not on file   Social History Narrative   Divorced, 2 children. Smoked 1-2ppd for 20 years but stopped in 1999.     No family history on file.  Review of Systems:  As stated in the HPI and otherwise negative.   BP 138/80  Pulse 76  Ht 5\' 7"  (1.702 m)  Wt 220 lb (99.791 kg)  BMI 34.45 kg/m2  Physical Examination: General: Well developed, well nourished, NAD HEENT: OP clear, mucus membranes moist SKIN: warm, dry. No rashes. Neuro: No focal deficits Musculoskeletal: Muscle strength 5/5 all ext Psychiatric: Mood and affect normal Neck: No JVD, no carotid bruits, no thyromegaly, no lymphadenopathy. Lungs:Clear bilaterally, no wheezes, rhonci, crackles Cardiovascular: Regular rate and rhythm. No murmurs, gallops or rubs. Abdomen:Soft. Bowel sounds present. Non-tender.  Extremities: No lower extremity edema. Pulses are 2 + in the bilateral DP/PT  EKG: NSR, rate 76 bpm.   Assessment and  Plan:   1. CAD: He has known multi-vessel disease with prior stents. Stress test normal June 2013. He does not tolerate statins. Continue ASA and beta blocker. I discussed adding dual anti-platelet therapy given findings of DAPT trial but he refuses Plavix and Effient/Brilinta due to intolerance in past.   2. HYPERTENSION: BP well controlled. No changes.   3. HYPERLIPIDEMIA: He has not been on a statin secondary to muscle aches. He tells me his lipids are followed in primary care and his total cholesterol was below 200.

## 2013-12-29 NOTE — Patient Instructions (Signed)
Your physician wants you to follow-up in:  6 months. You will receive a reminder letter in the mail two months in advance. If you don't receive a letter, please call our office to schedule the follow-up appointment.   

## 2014-07-04 ENCOUNTER — Ambulatory Visit: Payer: MEDICARE | Admitting: Cardiovascular Disease

## 2014-08-11 ENCOUNTER — Encounter: Payer: Self-pay | Admitting: Cardiovascular Disease

## 2014-08-11 ENCOUNTER — Ambulatory Visit (INDEPENDENT_AMBULATORY_CARE_PROVIDER_SITE_OTHER): Payer: MEDICARE | Admitting: Cardiovascular Disease

## 2014-08-11 VITALS — BP 140/80 | HR 77 | Ht 67.0 in | Wt 221.0 lb

## 2014-08-11 DIAGNOSIS — I2510 Atherosclerotic heart disease of native coronary artery without angina pectoris: Secondary | ICD-10-CM

## 2014-08-11 DIAGNOSIS — E785 Hyperlipidemia, unspecified: Secondary | ICD-10-CM

## 2014-08-11 DIAGNOSIS — I10 Essential (primary) hypertension: Secondary | ICD-10-CM

## 2014-08-11 MED ORDER — METOPROLOL SUCCINATE ER 50 MG PO TB24: 50.0000 mg | ORAL_TABLET | Freq: Every day | ORAL | Status: DC

## 2014-08-11 NOTE — Progress Notes (Signed)
History of Present Illness: 64 yo WM with h/o HTN, Hyperlipidemia, Crohns disease, DM, hiatal hernia and CAD diagnosed by cath August 2009 here for follow up. Pt had DES placed in proximal RCA August 2009 and drug eluting stents in the LAD and the OM in February 2011. His main complaint over the last few years has been his GI issues. He has been seen by local GI specialists and also at [facility] in the GI Department. Now followed in primary care by Dr. Collins Scotland and has been better on Asacol. Stress myoview June 2013 with no evidence of ischemia.    He is here today for follow up. He is doing well. No exertional chest pain or SOB. He has been off of a statin because of joint aches.   Primary Care Physician: Herb Grays  Last Lipid Profile: Followed in primary care  Past Medical History  Diagnosis Date  . Diabetes mellitus, type 2   . Hypertension   . Hiatal hernia   . Crohn's disease   . GERD (gastroesophageal reflux disease)   . Coronary artery disease     DES placed in proximal RCA August 2009 and drug eluting stents in the LAD and the OM in February 2011    Past Surgical History  Procedure Laterality Date  . Cholecystectomy    . Laporoscopic knee surgery    . Carpal tunnel release      Current Outpatient Prescriptions  Medication Sig Dispense Refill  . aspirin (ASPIR-81) 81 MG EC tablet Take 81 mg by mouth daily.      . clobetasol cream (TEMOVATE) 0.05 % As needed    . fluticasone (FLONASE) 50 MCG/ACT nasal spray As  needed    . Mesalamine (ASACOL HD) 800 MG TBEC Take 1 tablet by mouth 2 (two) times daily.     . metoprolol succinate (TOPROL-XL) 50 MG 24 hr tablet Take 1 tablet (50 mg total) by mouth daily. 90 tablet 2  . NEXIUM 40 MG capsule Take 1 capsule by mouth as needed.     . nitroGLYCERIN (NITROSTAT) 0.4 MG SL tablet Place 1 tablet (0.4 mg total) under the tongue every 5 (five) minutes as needed for chest pain. (Patient not taking: Reported on  08/11/2014) 25 tablet 6   No current facility-administered medications for this visit.    Allergies  Allergen Reactions  . Chlorthalidone     REACTION: dizzy  . Clopidogrel Bisulfate     REACTION: leg pain and fluid on knee  . Dicyclomine     REACTION: dizzy/bloated  . Doxycycline     REACTION: dizzy  . Metformin     REACTION: headache  . Niacin     REACTION: rash    History   Social History  . Marital Status: Divorced    Spouse Name: N/A  . Number of Children: N/A  . Years of Education: N/A   Occupational History  . Not on file.   Social History Main Topics  . Smoking status: Former Smoker    Quit date: 06/24/1989  . Smokeless tobacco: Not on file  . Alcohol Use: No  . Drug Use: No  . Sexual Activity: Not on file   Other Topics Concern  . Not on file   Social History Narrative   Divorced, 2 children. Smoked 1-2ppd for 20 years but stopped in 1999.     No family history on file.  Review of Systems:  As stated in the HPI and otherwise  negative.   BP 140/80 mmHg  Pulse 77  Ht  (1.702 m)  Wt 221 lb (100.245 kg)  BMI 34.61 kg/m2  Physical Examination: General: Well developed, well nourished, NAD HEENT: OP clear, mucus membranes moist SKIN: warm, dry. No rashes. Neuro: No focal deficits Musculoskeletal: Muscle strength 5/5 all ext Psychiatric: Mood and affect normal Neck: No JVD, no carotid bruits, no thyromegaly, no lymphadenopathy. Lungs:Clear bilaterally, no wheezes, rhonci, crackles Cardiovascular: Regular rate and rhythm. No murmurs, gallops or rubs. Abdomen:Soft. Bowel sounds present. Non-tender.  Extremities: No lower extremity edema. Pulses are 2 + in the bilateral DP/PT  Assessment and Plan:   1. CAD: He has known multi-vessel disease with prior stents. Stress test normal June 2013. He does not tolerate statins. Continue ASA and beta blocker. I discussed adding dual anti-platelet therapy but he refuses Plavix and Effient/Brilinta due to  intolerance in past. Will discuss stress testing at next appt.   2. HYPERTENSION: BP well controlled. No changes.   3. HYPERLIPIDEMIA: He has not been on a statin secondary to muscle aches. He tells me his lipids are followed in primary care and his total cholesterol was below 200.

## 2014-08-11 NOTE — Patient Instructions (Signed)
Your physician wants you to follow-up in:  6 months. You will receive a reminder letter in the mail two months in advance. If you don't receive a letter, please call our office to schedule the follow-up appointment.   

## 2015-03-16 ENCOUNTER — Encounter: Payer: Self-pay | Admitting: *Deleted

## 2015-03-17 ENCOUNTER — Encounter: Payer: Self-pay | Admitting: Cardiovascular Disease

## 2015-03-17 ENCOUNTER — Ambulatory Visit (INDEPENDENT_AMBULATORY_CARE_PROVIDER_SITE_OTHER): Payer: MEDICARE | Admitting: Cardiovascular Disease

## 2015-03-17 VITALS — BP 160/82 | HR 82 | Ht 67.0 in | Wt 205.4 lb

## 2015-03-17 DIAGNOSIS — E785 Hyperlipidemia, unspecified: Secondary | ICD-10-CM

## 2015-03-17 DIAGNOSIS — I10 Essential (primary) hypertension: Secondary | ICD-10-CM | POA: Diagnosis not present

## 2015-03-17 DIAGNOSIS — I2510 Atherosclerotic heart disease of native coronary artery without angina pectoris: Secondary | ICD-10-CM

## 2015-03-17 NOTE — Progress Notes (Signed)
Chief Complaint  Patient presents with  . Abdominal Pain      History of Present Illness: 64 yo WM with h/o HTN, Hyperlipidemia, Crohns disease, DM, hiatal hernia and CAD diagnosed by cath August 2009 here for follow up. Pt had DES placed in proximal RCA August 2009 and drug eluting stents in the LAD and the OM in February 2011. His main complaint over the last few years has been his GI issues. He has been seen by local GI specialists and also at [facility] in the GI Department. Now followed in primary care by Dr. Collins Scotland and has been better on Asacol. Stress myoview June 2013 with no evidence of ischemia.    He is here today for follow up. He has been having more GI upset and epigastric discomfort. No exertional chest pain or SOB. He has been off of a statin because of joint aches.   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 Diabetes mellitus, type 2   . Hypertension   . Hiatal hernia   . Crohn's disease   . GERD (gastroesophageal reflux disease)   . Coronary artery disease     DES placed in proximal RCA August 2009 and drug eluting stents in the LAD and the OM in February 2011    Past Surgical History  Procedure Laterality Date  . Cholecystectomy    . Laporoscopic knee surgery    . Carpal tunnel release      Current Outpatient Prescriptions  Medication Sig Dispense Refill  . aspirin (ASPIR-81) 81 MG EC tablet Take 81 mg by mouth daily.      . clobetasol cream (TEMOVATE) 0.05 % Apply 1 application topically 2 (two) times daily.     . fluticasone (FLONASE) 50 MCG/ACT nasal spray Place 1 spray into both nostrils daily.     . Mesalamine (ASACOL HD) 800 MG TBEC Take 1 tablet by mouth 2 (two) times daily.     . metoprolol succinate (TOPROL-XL) 50 MG 24 hr tablet Take 1 tablet (50 mg total) by mouth daily. 90 tablet 3  . NEXIUM 40 MG capsule Take 40 mg by mouth daily.     .  nitroGLYCERIN (NITROSTAT) 0.4 MG SL tablet Place 1 tablet (0.4 mg total) under the tongue every 5 (five) minutes as needed for chest pain. 25 tablet 6  . ONE TOUCH ULTRA TEST test strip CHECK GLUCOSE 3 TIMES DAILY  4   No current facility-administered medications for this visit.    Allergies  Allergen Reactions  . Chlorthalidone     REACTION: dizzy  . Clopidogrel Bisulfate     REACTION: leg pain and fluid on knee  . Dicyclomine     REACTION: dizzy/bloated  . Doxycycline     REACTION: dizzy  . Metformin     REACTION: headache  . Niacin     REACTION: rash    Social History   Social History  . Marital Status: Divorced    Spouse Name: N/A  . Number of Children: N/A  . Years of Education: N/A   Occupational History  . Not on file.   Social History Main Topics  . Smoking status: Former Smoker    Quit date: 06/24/1989  . Smokeless tobacco: Not on file  . Alcohol Use: No  . Drug Use: No  . Sexual Activity: Not on file   Other Topics Concern  .  Not on file   Social History Narrative   Divorced, 2 children. Smoked 1-2ppd for 20 years but stopped in 1999.     Family History  Problem Relation Age of Onset  . Stroke Mother   . Hypertension Mother   . Hypertension Brother     Review of Systems:  As stated in the HPI and otherwise negative.   BP 160/82 mmHg  Pulse 82  Ht  (1.702 m)  Wt 205 lb 6.4 oz (93.169 kg)  BMI 32.16 kg/m2  SpO2 96%  Physical Examination: General: Well developed, well nourished, NAD HEENT: OP clear, mucus membranes moist SKIN: warm, dry. No rashes. Neuro: No focal deficits Musculoskeletal: Muscle strength 5/5 all ext Psychiatric: Mood and affect normal Neck: No JVD, no carotid bruits, no thyromegaly, no lymphadenopathy. Lungs:Clear bilaterally, no wheezes, rhonci, crackles Cardiovascular: Regular rate and rhythm. No murmurs, gallops or rubs. Abdomen:Soft. Bowel sounds present. Non-tender.  Extremities: No lower extremity edema.  Pulses are 2 + in the bilateral DP/PT  EKG:  EKG is ordered today. The ekg ordered today demonstrates NSR, rate 82 bpm.   Recent Labs: No results found for requested labs within last 365 days.   Lipid Panel    Component Value Date/Time   CHOL 174 11/25/2011 1009   TRIG 118.0 11/25/2011 1009   HDL 34.50* 11/25/2011 1009   CHOLHDL 5 11/25/2011 1009   VLDL 23.6 11/25/2011 1009   LDLCALC 116* 11/25/2011 1009     Wt Readings from Last 3 Encounters:  03/17/15 205 lb 6.4 oz (93.169 kg)  08/11/14 221 lb (100.245 kg)  12/29/13 220 lb (99.791 kg)     Other studies Reviewed: Additional studies/ records that were reviewed today include: . Review of the above records demonstrates:    Assessment and Plan:   1. CAD: He has known multi-vessel disease with prior stents. Stress test normal June 2013. He does not tolerate statins. He is having epigastric. Not clearly like prior cardiac pain. Will arrange exercise stress myoview since it has been 3.5 years since last study. Continue ASA and beta blocker. I discussed adding dual anti-platelet therapy but he refuses Plavix and Effient/Brilinta due to intolerance in past.   2. HYPERTENSION: BP well controlled at home. No changes.   3. HYPERLIPIDEMIA: He has not been on a statin secondary to muscle aches. He tells me his lipids are followed in primary care and his total cholesterol was below 200.   Current medicines are reviewed at length with the patient today.  The patient does not have concerns regarding medicines.  The following changes have been made:  no change  Labs/ tests ordered today include:   Orders Placed This Encounter  Procedures  . Myocardial Perfusion Imaging  . EKG 12-Lead     Disposition:   FU with me in 6 months   Signed, Verne Carrow, MD 03/17/2015 5:16 PM    [facility] [address] [place], [place], [place]  [address] Phone: [phone]; Fax: [phone]

## 2015-03-17 NOTE — Patient Instructions (Signed)
Medication Instructions:  Your physician recommends that you continue on your current medications as directed. Please refer to the Current Medication list given to you today.   Labwork: none  Testing/Procedures: Your physician has requested that you have an exercise stress myoview. For further information please visit [email or web address] Please follow instruction sheet, as given.   Follow-Up: Your physician wants you to follow-up in: 6 months.  You will receive a reminder letter in the mail two months in advance. If you don't receive a letter, please call our office to schedule the follow-up appointment.   Any Other Special Instructions Will Be Listed Below (If Applicable).

## 2015-03-20 ENCOUNTER — Telehealth: Payer: Self-pay | Admitting: Cardiovascular Disease

## 2015-03-20 NOTE — Telephone Encounter (Signed)
Spoke w/pt and advised him to call his secondary insurance, [facility], and give them CPT code 16109 for his test on 04-10-15.  His medicare should pick up 80% of the allowable charge and [facility] would be able to advise him on what amount they would pay and be responsible for.  Pt understands and agrees.

## 2015-04-05 ENCOUNTER — Telehealth (HOSPITAL_COMMUNITY): Payer: Self-pay | Admitting: *Deleted

## 2015-04-05 NOTE — Telephone Encounter (Signed)
Patient given detailed instructions per Myocardial Perfusion Study Information Sheet for test on 04/10/15 RU045  Patient notified to arrive 15 minutes early and that it is imperative to arrive on time for appointment to keep from having the test rescheduled.  If you need to cancel or reschedule your appointment, please call the office within 24 hours of your appointment. Failure to do so may result in a cancellation of your appointment, and a $50 no show fee. Patient verbalized understanding. Antionette Char, RN

## 2015-04-10 ENCOUNTER — Ambulatory Visit (HOSPITAL_COMMUNITY): Payer: MEDICARE | Attending: Cardiovascular Disease

## 2015-04-10 DIAGNOSIS — I2510 Atherosclerotic heart disease of native coronary artery without angina pectoris: Secondary | ICD-10-CM | POA: Insufficient documentation

## 2015-04-10 DIAGNOSIS — I10 Essential (primary) hypertension: Secondary | ICD-10-CM | POA: Diagnosis not present

## 2015-04-10 DIAGNOSIS — R079 Chest pain, unspecified: Secondary | ICD-10-CM | POA: Diagnosis not present

## 2015-04-10 DIAGNOSIS — R5383 Other fatigue: Secondary | ICD-10-CM | POA: Diagnosis not present

## 2015-04-10 DIAGNOSIS — R9439 Abnormal result of other cardiovascular function study: Secondary | ICD-10-CM | POA: Insufficient documentation

## 2015-04-10 DIAGNOSIS — E119 Type 2 diabetes mellitus without complications: Secondary | ICD-10-CM | POA: Insufficient documentation

## 2015-04-10 LAB — MYOCARDIAL PERFUSION IMAGING
CHL CUP MPHR: 154 {beats}/min
CHL CUP NUCLEAR SDS: 2
CHL CUP RESTING HR STRESS: 69 {beats}/min
CSEPEDS: 0 s
CSEPEW: 10.1 METS
CSEPHR: 88 %
CSEPPHR: 137 {beats}/min
Exercise duration (min): 9 min
LHR: 0.29
LVDIAVOL: 89 mL
LVSYSVOL: 41 mL
SRS: 1
SSS: 3
TID: 0.88

## 2015-04-10 MED ORDER — TECHNETIUM TC 99M SESTAMIBI GENERIC - CARDIOLITE
10.6000 | Freq: Once | INTRAVENOUS | Status: AC | PRN
  Administered 2015-04-10: 11 via INTRAVENOUS

## 2015-04-10 MED ORDER — TECHNETIUM TC 99M SESTAMIBI GENERIC - CARDIOLITE
31.9000 | Freq: Once | INTRAVENOUS | Status: AC | PRN
  Administered 2015-04-10: 31.9 via INTRAVENOUS

## 2015-09-28 ENCOUNTER — Encounter: Payer: Self-pay | Admitting: Cardiovascular Disease

## 2015-09-28 ENCOUNTER — Ambulatory Visit (INDEPENDENT_AMBULATORY_CARE_PROVIDER_SITE_OTHER): Payer: MEDICARE | Admitting: Cardiovascular Disease

## 2015-09-28 VITALS — BP 138/84 | HR 81 | Ht 67.0 in | Wt 208.2 lb

## 2015-09-28 DIAGNOSIS — I10 Essential (primary) hypertension: Secondary | ICD-10-CM

## 2015-09-28 DIAGNOSIS — E785 Hyperlipidemia, unspecified: Secondary | ICD-10-CM | POA: Diagnosis not present

## 2015-09-28 DIAGNOSIS — I2510 Atherosclerotic heart disease of native coronary artery without angina pectoris: Secondary | ICD-10-CM

## 2015-09-28 NOTE — Patient Instructions (Signed)

## 2015-09-28 NOTE — Progress Notes (Signed)
Chief Complaint  Patient presents with  . Follow-up    no complaints  . Coronary Artery Disease   History of Present Illness: 65 yo WM with h/o HTN, Hyperlipidemia, Crohns disease, DM, hiatal hernia and CAD diagnosed by cath August 2009 here for follow up. Pt had DES placed in proximal RCA August 2009 and drug eluting stents in the LAD and the OM in February 2011. His main complaint over the last few years has been his GI issues. He has been seen by local GI specialists and also at [facility] in the GI Department. He is back on Asacol and feeling better. Stress myoview June 2013 with no evidence of ischemia.  He has been off of a statin because of joint aches.    He is here today for follow up. No exertional chest pain or SOB.  Primary Care Physician: Alain Honey, MD  Last Lipid Profile: Followed in primary care  Past Medical History  Diagnosis Date  . Diabetes mellitus, type 2 (HCC)   . Hypertension   . Hiatal hernia   . Crohn's disease (HCC)   . GERD (gastroesophageal reflux disease)   . Coronary artery disease     DES placed in proximal RCA August 2009 and drug eluting stents in the LAD and the OM in February 2011    Past Surgical History  Procedure Laterality Date  . Cholecystectomy    . Laporoscopic knee surgery    . Carpal tunnel release      Current Outpatient Prescriptions  Medication Sig Dispense Refill  . aspirin (ASPIR-81) 81 MG EC tablet Take 81 mg by mouth daily.      . clobetasol cream (TEMOVATE) 0.05 % Apply 1 application topically 2 (two) times daily.     . Mesalamine (ASACOL HD) 800 MG TBEC Take 1 tablet by mouth 2 (two) times daily.     . metoprolol succinate (TOPROL-XL) 50 MG 24 hr tablet Take 1 tablet (50 mg total) by mouth daily. 90 tablet 3  . NEXIUM 40 MG capsule Take 40 mg by mouth daily.     . nitroGLYCERIN (NITROSTAT) 0.4 MG SL tablet Place 1 tablet (0.4 mg total) under the tongue every 5 (five) minutes as needed for chest  pain. 25 tablet 6  . ONE TOUCH ULTRA TEST test strip CHECK GLUCOSE 3 TIMES DAILY  4   No current facility-administered medications for this visit.    Allergies  Allergen Reactions  . Chlorthalidone     REACTION: dizzy  . Clopidogrel Bisulfate     REACTION: leg pain and fluid on knee  . Dicyclomine     REACTION: dizzy/bloated  . Doxycycline     REACTION: dizzy  . Metformin     REACTION: headache  . Niacin     REACTION: rash    Social History   Social History  . Marital Status: Divorced    Spouse Name: N/A  . Number of Children: N/A  . Years of Education: N/A   Occupational History  . Not on file.   Social History Main Topics  . Smoking status: Former Smoker    Quit date: 06/24/1989  . Smokeless tobacco: Not on file  . Alcohol Use: No  . Drug Use: No  . Sexual Activity: Not on file   Other Topics Concern  . Not on file   Social History Narrative   Divorced, 2 children. Smoked 1-2ppd for 20 years but stopped in 1999.     Family History  Problem Relation Age of Onset  . Stroke Mother   . Hypertension Mother   . Hypertension Brother     Review of Systems:  As stated in the HPI and otherwise negative.   BP 138/84 mmHg  Pulse 81  Ht  (1.702 m)  Wt 208 lb 3.2 oz (94.439 kg)  BMI 32.60 kg/m2  SpO2 97%  Physical Examination: General: Well developed, well nourished, NAD HEENT: OP clear, mucus membranes moist SKIN: warm, dry. No rashes. Neuro: No focal deficits Musculoskeletal: Muscle strength 5/5 all ext Psychiatric: Mood and affect normal Neck: No JVD, no carotid bruits, no thyromegaly, no lymphadenopathy. Lungs:Clear bilaterally, no wheezes, rhonci, crackles Cardiovascular: Regular rate and rhythm. No murmurs, gallops or rubs. Abdomen:Soft. Bowel sounds present. Non-tender.  Extremities: No lower extremity edema. Pulses are 2 + in the bilateral DP/PT  EKG:  EKG is ordered today. The ekg ordered today demonstrates NSR, rate 82 bpm.   Recent  Labs: No results found for requested labs within last 365 days.   Lipid Panel Followed in primary care   Wt Readings from Last 3 Encounters:  09/28/15 208 lb 3.2 oz (94.439 kg)  04/10/15 205 lb (92.987 kg)  03/17/15 205 lb 6.4 oz (93.169 kg)     Other studies Reviewed: Additional studies/ records that were reviewed today include: . Review of the above records demonstrates:    Assessment and Plan:   1. CAD: He has known multi-vessel disease with prior stents. Nuclear stress test October 2016 with no ischemia. He does not tolerate statins. Continue ASA and beta blocker. I discussed adding dual anti-platelet therapy but he refuses Plavix and Effient/Brilinta due to intolerance in past.   2. HYPERTENSION: BP well controlled at home. No changes.   3. HYPERLIPIDEMIA: He has not been on a statin secondary to muscle aches. He tells me his lipids are followed in primary care and his total cholesterol was below 200. We have discussed Praluent injections but he refuses to consider this.   Current medicines are reviewed at length with the patient today.  The patient does not have concerns regarding medicines.  The following changes have been made:  no change  Labs/ tests ordered today include:   No orders of the defined types were placed in this encounter.     Disposition:   FU with me in 6 months   Signed, Verne Carrow, MD 09/28/2015 12:50 PM    [facility] [address] [place], [place], [place]  [address] Phone: [phone]; Fax: [phone]

## 2016-04-24 NOTE — Progress Notes (Signed)
Chief Complaint  Patient presents with  . Abdominal Pain   History of Present Illness: 65 yo WM with h/o HTN, Hyperlipidemia, Crohns disease, DM, hiatal hernia and CAD here for follow up. Pt had DES placed in proximal RCA August 2009 and drug eluting stents in the LAD and the OM in February 2011. His main complaint over the last few years has been his GI issues. He has been seen by local GI specialists and also [facility]. He is back on Asacol and feeling better. Stress myoview  October 2016 with no evidence of ischemia.  He has been off of a statin because of joint aches.    He is here today for follow up. No exertional chest pain or SOB. He has chest burning after fatty meals or after caffeine intake. He spends most of our visit today discussing his rash, hernia and GI issues.   Primary Care Physician: Alain HoneyHERBER,RICHARD, MD   Past Medical History:  Diagnosis Date  . Coronary artery disease    DES placed in proximal RCA August 2009 and drug eluting stents in the LAD and the OM in February 2011  . Crohn's disease (HCC)   . Diabetes mellitus, type 2 (HCC)   . GERD (gastroesophageal reflux disease)   . Hiatal hernia   . Hypertension     Past Surgical History:  Procedure Laterality Date  . CARPAL TUNNEL RELEASE    . CHOLECYSTECTOMY    . laporoscopic knee surgery      Current Outpatient Prescriptions  Medication Sig Dispense Refill  . aspirin (ASPIR-81) 81 MG EC tablet Take 81 mg by mouth daily.      . clobetasol cream (TEMOVATE) 0.05 % Apply 1 application topically 2 (two) times daily as needed. (psorasis)    . esomeprazole (NEXIUM) 40 MG capsule Take 40 mg by mouth daily as needed (heartburn).    . Mesalamine (ASACOL HD) 800 MG TBEC Take 1 tablet by mouth 2 (two) times daily.     . metoprolol succinate (TOPROL-XL) 50 MG 24 hr tablet Take 1 tablet (50 mg total) by mouth daily. 90 tablet 3  . nitroGLYCERIN (NITROSTAT) 0.4 MG SL tablet Place 1  tablet (0.4 mg total) under the tongue every 5 (five) minutes as needed for chest pain. 25 tablet 6  . ONE TOUCH ULTRA TEST test strip CHECK GLUCOSE 3 TIMES DAILY  4   No current facility-administered medications for this visit.     Allergies  Allergen Reactions  . Chlorthalidone     REACTION: dizzy  . Clopidogrel Bisulfate     REACTION: leg pain and fluid on knee  . Dicyclomine     REACTION: dizzy/bloated  . Doxycycline     REACTION: dizzy  . Metformin     REACTION: headache  . Niacin     REACTION: rash    Social History   Social History  . Marital status: Divorced    Spouse name: N/A  . Number of children: N/A  . Years of education: N/A   Occupational History  . Not on file.   Social History Main Topics  . Smoking status: Former Smoker    Quit date: 06/24/1989  . Smokeless tobacco: Current User    Types: Chew  . Alcohol use No  . Drug use: No  . Sexual activity: Not on file   Other Topics Concern  . Not on file   Social History Narrative   Divorced, 2 children.  Smoked 1-2ppd for 20 years but stopped in 1999.     Family History  Problem Relation Age of Onset  . Stroke Mother   . Hypertension Mother   . Hypertension Brother     Review of Systems:  As stated in the HPI and otherwise negative.   BP (!) 144/82   Pulse 74   Ht 5' 7.5" (1.715 m)   Wt 213 lb 3.2 oz (96.7 kg)   BMI 32.90 kg/m   Physical Examination: General: Well developed, well nourished, NAD  HEENT: OP clear, mucus membranes moist  SKIN: warm, dry. No rashes. Neuro: No focal deficits  Musculoskeletal: Muscle strength 5/5 all ext  Psychiatric: Mood and affect normal  Neck: No JVD, no carotid bruits, no thyromegaly, no lymphadenopathy.  Lungs:Clear bilaterally, no wheezes, rhonci, crackles Cardiovascular: Regular rate and rhythm. No murmurs, gallops or rubs. Abdomen:Soft. Bowel sounds present. Non-tender.  Extremities: No lower extremity edema. Pulses are 2 + in the bilateral  DP/PT  EKG:  EKG is ordered today. The ekg ordered today demonstrates NSR, rate 74 bpm.   Recent Labs: No results found for requested labs within last 8760 hours.   Lipid Panel Followed in primary care   Wt Readings from Last 3 Encounters:  04/25/16 213 lb 3.2 oz (96.7 kg)  09/28/15 208 lb 3.2 oz (94.4 kg)  04/10/15 205 lb (93 kg)     Other studies Reviewed: Additional studies/ records that were reviewed today include: . Review of the above records demonstrates:    Assessment and Plan:   1. CAD without angina: He has known multi-vessel disease with prior stents. Nuclear stress test October 2016 with no ischemia. He does not tolerate statins. Continue ASA and beta blocker. I discussed adding dual anti-platelet therapy but he refuses Plavix and Effient/Brilinta due to intolerance in past.   2. HYPERTENSION: BP well controlled at home. No changes.   3. HYPERLIPIDEMIA: He has not been on a statin secondary to muscle aches. He tells me his lipids are followed in primary care and his total cholesterol was below 200. We have discussed Praluent injections but he refuses to consider this.   Current medicines are reviewed at length with the patient today.  The patient does not have concerns regarding medicines.  The following changes have been made:  no change  Labs/ tests ordered today include:   Orders Placed This Encounter  Procedures  . EKG 12-Lead    Disposition:   FU with me in 6 months   Signed, Verne Carrow, MD 04/25/2016 10:00 AM    [facility] [address] [place], [place], [place]  [address] Phone: [phone]; Fax: [phone]

## 2016-04-25 ENCOUNTER — Encounter (INDEPENDENT_AMBULATORY_CARE_PROVIDER_SITE_OTHER): Payer: Self-pay

## 2016-04-25 ENCOUNTER — Encounter: Payer: Self-pay | Admitting: Cardiovascular Disease

## 2016-04-25 ENCOUNTER — Ambulatory Visit (INDEPENDENT_AMBULATORY_CARE_PROVIDER_SITE_OTHER): Payer: MEDICARE | Admitting: Cardiovascular Disease

## 2016-04-25 VITALS — BP 144/82 | HR 74 | Ht 67.5 in | Wt 213.2 lb

## 2016-04-25 DIAGNOSIS — I10 Essential (primary) hypertension: Secondary | ICD-10-CM

## 2016-04-25 DIAGNOSIS — E7800 Pure hypercholesterolemia, unspecified: Secondary | ICD-10-CM

## 2016-04-25 DIAGNOSIS — I2510 Atherosclerotic heart disease of native coronary artery without angina pectoris: Secondary | ICD-10-CM

## 2016-04-25 NOTE — Patient Instructions (Signed)

## 2016-06-25 ENCOUNTER — Other Ambulatory Visit: Payer: Self-pay

## 2016-06-25 MED ORDER — METOPROLOL SUCCINATE ER 50 MG PO TB24: 50.0000 mg | ORAL_TABLET | Freq: Every day | ORAL | 3 refills | Status: DC

## 2016-11-26 NOTE — Progress Notes (Signed)
Chief Complaint  Patient presents with  . Follow-up    CAD   History of Present Illness: 66 yo male with history of CAD, HTN, Hyperlipidemia, Crohns disease, DM, hiatal hernia here today for cardiac follow up. He had a drug eluting stent placed in the proximal RCA in 2009 and drug eluting stents placed in the LAD and OM in 2011. He has chronic issues with diarrhea due to his Crohns disease. Stress myoview October 2016 with no evidence of ischemia.  He has been off of a statin because of joint aches.    He is here today for follow up. The patient denies any chest pain, dyspnea, palpitations, lower extremity edema, orthopnea, PND, dizziness, near syncope or syncope. He does have c/o rash, allergies, neck pain and thinks this is all related to his Crohn's disease.   Primary Care Physician: Alain Honey, MD   Past Medical History:  Diagnosis Date  . Coronary artery disease    DES placed in proximal RCA August 2009 and drug eluting stents in the LAD and the OM in February 2011  . Crohn's disease (HCC)   . Diabetes mellitus, type 2 (HCC)   . GERD (gastroesophageal reflux disease)   . Hiatal hernia   . Hypertension     Past Surgical History:  Procedure Laterality Date  . CARPAL TUNNEL RELEASE    . CHOLECYSTECTOMY    . laporoscopic knee surgery      Current Outpatient Prescriptions  Medication Sig Dispense Refill  . aspirin (ASPIR-81) 81 MG EC tablet Take 81 mg by mouth daily.      . clobetasol cream (TEMOVATE) 0.05 % Apply 1 application topically 2 (two) times daily as needed. (psorasis)    . Mesalamine (ASACOL HD) 800 MG TBEC Take 1 tablet by mouth 2 (two) times daily.     . metoprolol succinate (TOPROL-XL) 50 MG 24 hr tablet Take 1 tablet (50 mg total) by mouth daily. 90 tablet 3  . nitroGLYCERIN (NITROSTAT) 0.4 MG SL tablet Place 1 tablet (0.4 mg total) under the tongue every 5 (five) minutes as needed for chest pain. 25 tablet 6  . ONE TOUCH ULTRA TEST test strip CHECK  GLUCOSE 3 TIMES DAILY  4   No current facility-administered medications for this visit.     Allergies  Allergen Reactions  . Chlorthalidone     REACTION: dizzy  . Clopidogrel Bisulfate     REACTION: leg pain and fluid on knee  . Dicyclomine     REACTION: dizzy/bloated  . Doxycycline     REACTION: dizzy  . Metformin     REACTION: headache  . Niacin     REACTION: rash    Social History   Social History  . Marital status: Divorced    Spouse name: N/A  . Number of children: N/A  . Years of education: N/A   Occupational History  . Not on file.   Social History Main Topics  . Smoking status: Former Smoker    Quit date: 06/24/1989  . Smokeless tobacco: Current User    Types: Chew  . Alcohol use No  . Drug use: No  . Sexual activity: Not on file   Other Topics Concern  . Not on file   Social History Narrative   Divorced, 2 children. Smoked 1-2ppd for 20 years but stopped in 1999.     Family History  Problem Relation Age of Onset  . Stroke Mother   . Hypertension Mother   . Hypertension  Brother     Review of Systems:  As stated in the HPI and otherwise negative.   BP 130/70   Pulse 71   Ht 5' 7.5" (1.715 m)   Wt 206 lb 6.4 oz (93.6 kg)   SpO2 95%   BMI 31.85 kg/m   Physical Examination:  General: Well developed, well nourished, NAD  HEENT: OP clear, mucus membranes moist  SKIN: warm, dry. No rashes. Neuro: No focal deficits  Musculoskeletal: Muscle strength 5/5 all ext  Psychiatric: Mood and affect normal  Neck: No JVD, no carotid bruits, no thyromegaly, no lymphadenopathy.  Lungs:Clear bilaterally, no wheezes, rhonci, crackles Cardiovascular: Regular rate and rhythm. No murmurs, gallops or rubs. Abdomen:Soft. Bowel sounds present. Non-tender.  Extremities: No lower extremity edema. Pulses are 2 + in the bilateral DP/PT.  EKG:  EKG is not ordered today. The ekg ordered today demonstrates   Recent Labs: No results found for requested labs within  last 8760 hours.   Lipid Panel Followed in primary care   Wt Readings from Last 3 Encounters:  11/27/16 206 lb 6.4 oz (93.6 kg)  04/25/16 213 lb 3.2 oz (96.7 kg)  09/28/15 208 lb 3.2 oz (94.4 kg)     Other studies Reviewed: Additional studies/ records that were reviewed today include: . Review of the above records demonstrates:    Assessment and Plan:   1. CAD without angina: He has no chest pain suggestive of angina. He has multi-vessel CAD with prior stenting of the RCA, LAD and OM. Stress test October 2016 with no ischemia. Will continue ASA and beta blocker. He does not tolerate statins.    2. HYPERTENSION: BP is controlled. No changes  3. HYPERLIPIDEMIA: Followed in primary care. Statin intolerant. Unwilling to consider Praluent or Repatha injections.    Current medicines are reviewed at length with the patient today.  The patient does not have concerns regarding medicines.  The following changes have been made:  no change  Labs/ tests ordered today include:   No orders of the defined types were placed in this encounter.   Disposition:   FU with me in 6 months   Signed, Verne Carrow, MD 11/27/2016 12:08 PM    [facility] [address] [place], [place], [place]  [address] Phone: [phone]; Fax: [phone]

## 2016-11-27 ENCOUNTER — Encounter: Payer: Self-pay | Admitting: Cardiovascular Disease

## 2016-11-27 ENCOUNTER — Ambulatory Visit (INDEPENDENT_AMBULATORY_CARE_PROVIDER_SITE_OTHER): Payer: MEDICARE | Admitting: Cardiovascular Disease

## 2016-11-27 VITALS — BP 130/70 | HR 71 | Ht 67.5 in | Wt 206.4 lb

## 2016-11-27 DIAGNOSIS — I10 Essential (primary) hypertension: Secondary | ICD-10-CM | POA: Diagnosis not present

## 2016-11-27 DIAGNOSIS — E7800 Pure hypercholesterolemia, unspecified: Secondary | ICD-10-CM | POA: Diagnosis not present

## 2016-11-27 DIAGNOSIS — I2510 Atherosclerotic heart disease of native coronary artery without angina pectoris: Secondary | ICD-10-CM | POA: Diagnosis not present

## 2016-11-27 NOTE — Patient Instructions (Signed)

## 2017-02-25 ENCOUNTER — Other Ambulatory Visit: Payer: Self-pay | Admitting: Cardiovascular Disease

## 2017-04-21 ENCOUNTER — Telehealth: Payer: Self-pay | Admitting: Cardiovascular Disease

## 2017-04-21 NOTE — Telephone Encounter (Signed)
New Message  Pt c/o medication issue:  1. Name of Medication: Metoprolol   2. How are you currently taking this medication (dosage and times per day)? 50mg   3. Are you having a reaction (difficulty breathing--STAT)? No   4. What is your medication issue? Per pt would like to speak with RN to ask questions about medication. Please call back to discuss

## 2017-04-21 NOTE — Telephone Encounter (Signed)
Pt calling concerned about his medication because it was delivered by his mail order pharmacy in September and was hot when he took it out of the mailbox.  He is asking if it could effect the medication and make it "lethal".  Advised most medications should be stored at room temperature.  He has call the mail order pharmacy with his concerns and they will sent him another 90 day supply.  He is very anxious about this and his BP was elevated this AM 182/82 HR 76  He will continue to monitor and c/b if further concerns.

## 2017-05-26 ENCOUNTER — Telehealth: Payer: Self-pay | Admitting: Cardiovascular Disease

## 2017-05-26 NOTE — Telephone Encounter (Signed)
New Message   He has been taking an extra 25mg  at night when is blood pressure is elevated , so he needs extra  Medication called in    *STAT* If patient is at the pharmacy, call can be transferred to refill team.   1. Which medications need to be refilled? (please list name of each medication and dose if known)  metoprolol succinate (TOPROL-XL) 50 MG 24 hr tablet TAKE 1 TABLET BY MOUTH DAILY    2. Which pharmacy/location (including street and city if local pharmacy) is medication to be sent to? [facility] in [place]   3. Do they need a 30 day or 90 day supply? 30

## 2017-05-26 NOTE — Telephone Encounter (Signed)
That is ok with me. Thanks

## 2017-05-26 NOTE — Telephone Encounter (Signed)
I spoke with pt. He is requesting a separate prescription for Toprol 25 mg to take as needed for elevated BP.  He is currently on Toprol 50 mg daily. For the last week or so his BP has been elevated at night.  Has taken 25 mg Toprol at night 3 times in the last week due to diastolic BP greater than 86.  Reports recent readings of 146/80,147/75,175/89,152/79,162/81, 168/89 ( he had a headache at this time), 171/89.  Heart rate 68-84. Is having a lot of shoulder pain and is seeing doctor about this.  He does not want to start a new medication for BP due to stomach issues. Will forward to Dr. Clifton James to see if OK to order prn Toprol 25.

## 2017-05-26 NOTE — Telephone Encounter (Signed)
Left message to call back  

## 2017-05-28 MED ORDER — METOPROLOL SUCCINATE ER 25 MG PO TB24
ORAL_TABLET | ORAL | 6 refills | Status: DC
Start: 2017-05-28 — End: 2017-06-26

## 2017-05-28 NOTE — Telephone Encounter (Signed)
Pt notified. He would like [facility] to [facility] [place].

## 2017-06-25 ENCOUNTER — Telehealth: Payer: Self-pay | Admitting: Cardiovascular Disease

## 2017-06-25 NOTE — Telephone Encounter (Signed)
New Message   *STAT* If patient is at the pharmacy, call can be transferred to refill team.   1. Which medications need to be refilled? (please list name of each medication and dose if known) metoprolol succinate (TOPROL-XL) 50 MG 24 hr tablet 2. Which pharmacy/location (including street and city if local pharmacy) is medication to be sent to? [facility]  3. Do they need a 30 day or 90 day supply? 90

## 2017-06-26 MED ORDER — METOPROLOL SUCCINATE ER 50 MG PO TB24: 50.0000 mg | ORAL_TABLET | Freq: Every day | ORAL | 1 refills | Status: DC

## 2017-06-26 MED ORDER — METOPROLOL SUCCINATE ER 25 MG PO TB24: ORAL_TABLET | ORAL | 1 refills | Status: DC

## 2017-06-26 NOTE — Telephone Encounter (Signed)
Pt's medication was sent to pt's pharmacy as requested. Confirmation received.  °

## 2017-07-28 ENCOUNTER — Ambulatory Visit (INDEPENDENT_AMBULATORY_CARE_PROVIDER_SITE_OTHER): Payer: MEDICARE | Admitting: Cardiovascular Disease

## 2017-07-28 ENCOUNTER — Encounter: Payer: Self-pay | Admitting: Cardiovascular Disease

## 2017-07-28 VITALS — BP 140/78 | HR 71 | Ht 67.5 in | Wt 208.6 lb

## 2017-07-28 DIAGNOSIS — E7800 Pure hypercholesterolemia, unspecified: Secondary | ICD-10-CM | POA: Diagnosis not present

## 2017-07-28 DIAGNOSIS — I779 Disorder of arteries and arterioles, unspecified: Secondary | ICD-10-CM

## 2017-07-28 DIAGNOSIS — I10 Essential (primary) hypertension: Secondary | ICD-10-CM

## 2017-07-28 DIAGNOSIS — I739 Peripheral vascular disease, unspecified: Secondary | ICD-10-CM

## 2017-07-28 DIAGNOSIS — I2510 Atherosclerotic heart disease of native coronary artery without angina pectoris: Secondary | ICD-10-CM | POA: Diagnosis not present

## 2017-07-28 LAB — BASIC METABOLIC PANEL
BUN / CREAT RATIO: 17 (ref 10–24)
BUN: 17 mg/dL (ref 8–27)
CALCIUM: 9.5 mg/dL (ref 8.6–10.2)
CHLORIDE: 102 mmol/L (ref 96–106)
CO2: 23 mmol/L (ref 20–29)
Creatinine, Ser: 1.03 mg/dL (ref 0.76–1.27)
GFR calc non Af Amer: 74 mL/min/{1.73_m2} (ref >59)
GFR, EST AFRICAN AMERICAN: 86 mL/min/{1.73_m2} (ref >59)
GLUCOSE: 146 mg/dL — AB (ref 65–99)
POTASSIUM: 4.5 mmol/L (ref 3.5–5.2)
Sodium: 140 mmol/L (ref 134–144)

## 2017-07-28 NOTE — Progress Notes (Signed)
Chief Complaint  Patient presents with  . Follow-up    CAD   History of Present Illness: 67 yo male with history of CAD, HTN, hyperlipidemia, Crohns disease, DM, hiatal hernia here today for cardiac follow up. He had a drug eluting stent placed in the proximal RCA in 2009 and drug eluting stents placed in the LAD and OM in 2011. He has chronic issues with diarrhea due to his Crohns disease. Stress myoview October 2016 with no evidence of ischemia.  He has been off of a statin because of joint aches. He has been taking ASA and a beta blocker. He has had issues with shoulder joint pain and back pain and this is being followed at [facility]. MRI at [facility] suggested disease in the right vertebral artery. Neck CTA was suggested.    He is here today for follow up. The patient denies any chest pain, dyspnea, palpitations, lower extremity edema, orthopnea, PND, dizziness, near syncope or syncope. He has shoulder pain. Seeing ortho and pain specialist at [facility]. Abnormal vertebral artery noted on MRI neck at [facility].   Primary Care Physician: Alain Honey, MD   Past Medical History:  Diagnosis Date  . Coronary artery disease    DES placed in proximal RCA August 2009 and drug eluting stents in the LAD and the OM in February 2011  . Crohn's disease (HCC)   . Diabetes mellitus, type 2 (HCC)   . GERD (gastroesophageal reflux disease)   . Hiatal hernia   . Hypertension     Past Surgical History:  Procedure Laterality Date  . CARPAL TUNNEL RELEASE    . CHOLECYSTECTOMY    . laporoscopic knee surgery      Current Outpatient Medications  Medication Sig Dispense Refill  . aspirin (ASPIR-81) 81 MG EC tablet Take 81 mg by mouth daily.      . clobetasol cream (TEMOVATE) 0.05 % Apply 1 application topically 2 (two) times daily as needed. (psorasis)    . gabapentin (NEURONTIN) 100 MG capsule Take 3 capsules by mouth 3 (three) times daily.  3  . Mesalamine (ASACOL HD) 800 MG TBEC Take 1 tablet by  mouth 2 (two) times daily.     . metoprolol succinate (TOPROL XL) 25 MG 24 hr tablet Take one tablet by mouth daily as needed for elevated BP in the evening. 90 tablet 1  . metoprolol succinate (TOPROL-XL) 50 MG 24 hr tablet Take 1 tablet (50 mg total) by mouth daily. Take with or immediately following a meal. 90 tablet 1  . nitroGLYCERIN (NITROSTAT) 0.4 MG SL tablet Place 1 tablet (0.4 mg total) under the tongue every 5 (five) minutes as needed for chest pain. 25 tablet 6  . ONE TOUCH ULTRA TEST test strip CHECK GLUCOSE 3 TIMES DAILY  4   No current facility-administered medications for this visit.     Allergies  Allergen Reactions  . Chlorthalidone     REACTION: dizzy  . Clopidogrel Bisulfate     REACTION: leg pain and fluid on knee  . Dicyclomine     REACTION: dizzy/bloated  . Doxycycline     REACTION: dizzy  . Metformin     REACTION: headache  . Niacin     REACTION: rash    Social History   Socioeconomic History  . Marital status: Divorced    Spouse name: Not on file  . Number of children: Not on file  . Years of education: Not on file  . Highest education level: Not  on file  Social Needs  . Financial resource strain: Not on file  . Food insecurity - worry: Not on file  . Food insecurity - inability: Not on file  . Transportation needs - medical: Not on file  . Transportation needs - non-medical: Not on file  Occupational History  . Not on file  Tobacco Use  . Smoking status: Former Smoker    Last attempt to quit: 06/24/1989    Years since quitting: 28.1  . Smokeless tobacco: Current User    Types: Chew  Substance and Sexual Activity  . Alcohol use: No  . Drug use: No  . Sexual activity: Not on file  Other Topics Concern  . Not on file  Social History Narrative   Divorced, 2 children. Smoked 1-2ppd for 20 years but stopped in 1999.     Family History  Problem Relation Age of Onset  . Stroke Mother   . Hypertension Mother   . Hypertension Brother      Review of Systems:  As stated in the HPI and otherwise negative.   BP 140/78   Pulse 71   Ht 5' 7.5" (1.715 m)   Wt 208 lb 9.6 oz (94.6 kg)   SpO2 95%   BMI 32.19 kg/m   Physical Examination:  General: Well developed, well nourished, NAD  HEENT: OP clear, mucus membranes moist  SKIN: warm, dry. No rashes. Neuro: No focal deficits  Musculoskeletal: Muscle strength 5/5 all ext  Psychiatric: Mood and affect normal  Neck: No JVD, no carotid bruits, no thyromegaly, no lymphadenopathy.  Lungs:Clear bilaterally, no wheezes, rhonci, crackles Cardiovascular: Regular rate and rhythm. No murmurs, gallops or rubs. Abdomen:Soft. Bowel sounds present. Non-tender.  Extremities: No lower extremity edema. Pulses are 2 + in the bilateral DP/PT.  EKG:  EKG is  ordered today. The ekg ordered today demonstrates NSR  Recent Labs: No results found for requested labs within last 8760 hours.   Lipid Panel Lipid Panel     Component Value Date/Time   CHOL 174 11/25/2011 1009   TRIG 118.0 11/25/2011 1009   HDL 34.50 (L) 11/25/2011 1009   CHOLHDL 5 11/25/2011 1009   VLDL 23.6 11/25/2011 1009   LDLCALC 116 (H) 11/25/2011 1009     Wt Readings from Last 3 Encounters:  07/28/17 208 lb 9.6 oz (94.6 kg)  11/27/16 206 lb 6.4 oz (93.6 kg)  04/25/16 213 lb 3.2 oz (96.7 kg)     Other studies Reviewed: Additional studies/ records that were reviewed today include: . Review of the above records demonstrates:    Assessment and Plan:   1. CAD without angina: No chest pain suggestive of angina. He has multi-vessel CAD and has had previous stents placed in the LAD, RCA and OM. Will continue ASA and beta blocker. He is intolerant of statins.     2. HYPERTENSION: BP is well controlled. No changes today.   3. HYPERLIPIDEMIA: He is statin intolerant and unwilling to consider Praluent or Repatha injections.     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 Radiology  suggested neck CTA to better define. Will arrange neck CTA today  Current medicines are reviewed at length with the patient today.  The patient does not have concerns regarding medicines.  The following changes have been made:  no change  Labs/ tests ordered today include:   Orders Placed This Encounter  Procedures  . CT ANGIO NECK W OR WO CONTRAST  . Basic  Metabolic Panel (BMET)  . EKG 12-Lead    Disposition:   FU with me in 6 months   Signed, Verne Carrow, MD 07/28/2017 12:34 PM    [facility] [address] [place], [place], [place]  [address] Phone: [phone]; Fax: [phone]

## 2017-07-28 NOTE — Patient Instructions (Signed)
Medication Instructions:  Your physician recommends that you continue on your current medications as directed. Please refer to the Current Medication list given to you today.   Labwork: Lab work to be done today--BMP  Testing/Procedures: Non-Cardiac CT Angiography (CTA), is a special type of CT scan that uses a computer to produce multi-dimensional views of major blood vessels throughout the body. In CT angiography, a contrast material is injected through an IV to help visualize the blood vessels  No solid foods for 2 hours prior to this test  Follow-Up: Your physician recommends that you schedule a follow-up appointment in: 6 months. Please call our office in about 3 months to schedule this appointment    Any Other Special Instructions Will Be Listed Below (If Applicable).     If you need a refill on your cardiac medications before your next appointment, please call your pharmacy.

## 2017-08-05 ENCOUNTER — Ambulatory Visit (INDEPENDENT_AMBULATORY_CARE_PROVIDER_SITE_OTHER)
Admission: RE | Admit: 2017-08-05 | Discharge: 2017-08-05 | Disposition: A | Discharge status: Home / Self Care | Payer: MEDICARE | Source: Ambulatory Visit | Attending: Cardiovascular Disease | Admitting: Cardiovascular Disease

## 2017-08-05 DIAGNOSIS — I739 Peripheral vascular disease, unspecified: Principal | ICD-10-CM

## 2017-08-05 DIAGNOSIS — I779 Disorder of arteries and arterioles, unspecified: Secondary | ICD-10-CM | POA: Diagnosis not present

## 2017-08-05 MED ORDER — IOPAMIDOL (ISOVUE-370) INJECTION 76%: 80.0000 mL | Freq: Once | INTRAVENOUS | Status: DC | PRN

## 2017-08-05 MED ORDER — IOPAMIDOL (ISOVUE-370) INJECTION 76%
80.0000 mL | Freq: Once | INTRAVENOUS | Status: AC | PRN
  Administered 2017-08-05: 80 mL via INTRAVENOUS

## 2017-08-07 ENCOUNTER — Telehealth: Payer: Self-pay | Admitting: Cardiovascular Disease

## 2017-08-07 NOTE — Telephone Encounter (Signed)
Pt aware of CT Angio of the neck results per Dr Clifton James.  Pt verbalized understanding and agrees with this plan.

## 2017-08-07 NOTE — Telephone Encounter (Signed)
Notes recorded by Kathleene Hazel, MD on 08/05/2017 at 4:00 PM EST Mild plaque in the carotid arteries. 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 The left vertebral artery is ok. There is nothing to be done about this from a clinical perspective. Can we let him know? Thanks, chris

## 2017-08-07 NOTE — Telephone Encounter (Signed)
Left a message for the pt to call the office back. 

## 2017-08-07 NOTE — Telephone Encounter (Signed)
Patient return call for results. Patient states that he will have to leave the house around 9 am

## 2017-12-15 ENCOUNTER — Other Ambulatory Visit: Payer: Self-pay | Admitting: Cardiovascular Disease

## 2017-12-15 MED ORDER — METOPROLOL SUCCINATE ER 50 MG PO TB24: 50.0000 mg | ORAL_TABLET | Freq: Every day | ORAL | 2 refills | Status: DC

## 2017-12-15 NOTE — Telephone Encounter (Signed)
Pt's medication was sent to pt's pharmacy as requested. Confirmation received.  °

## 2017-12-15 NOTE — Telephone Encounter (Signed)
New Message    *STAT* If patient is at the pharmacy, call can be transferred to refill team.   1. Which medications need to be refilled? (please list name of each medication and dose if known) metoprolol succinate (TOPROL-XL) 50 MG 24 hr tablet  2. Which pharmacy/location (including street and city if local pharmacy) is medication to be sent to?[facility] - [place], [place] - [address]  3. Do they need a 30 day or 90 day supply? 90 day supply    Patient wants to be notified when the rx has been sent

## 2018-02-16 ENCOUNTER — Ambulatory Visit (INDEPENDENT_AMBULATORY_CARE_PROVIDER_SITE_OTHER): Payer: MEDICARE | Admitting: Cardiovascular Disease

## 2018-02-16 VITALS — BP 160/90 | HR 94 | Ht 67.5 in | Wt 197.0 lb

## 2018-02-16 DIAGNOSIS — I10 Essential (primary) hypertension: Secondary | ICD-10-CM

## 2018-02-16 DIAGNOSIS — I2510 Atherosclerotic heart disease of native coronary artery without angina pectoris: Secondary | ICD-10-CM | POA: Diagnosis not present

## 2018-02-16 DIAGNOSIS — E7800 Pure hypercholesterolemia, unspecified: Secondary | ICD-10-CM | POA: Diagnosis not present

## 2018-02-16 DIAGNOSIS — I779 Disorder of arteries and arterioles, unspecified: Secondary | ICD-10-CM | POA: Diagnosis not present

## 2018-02-16 DIAGNOSIS — I739 Peripheral vascular disease, unspecified: Secondary | ICD-10-CM

## 2018-02-16 MED ORDER — METOPROLOL SUCCINATE ER 50 MG PO TB24: 50.0000 mg | ORAL_TABLET | Freq: Two times a day (BID) | ORAL | 3 refills | Status: DC

## 2018-02-16 NOTE — Progress Notes (Signed)
Chief Complaint  Patient presents with  . Follow-up    CAD   History of Present Illness: 67 yo male with history of CAD, HTN, hyperlipidemia, Crohns disease, DM, hiatal hernia, vertebral artery disease, carotid artery disease  here today for cardiac follow up. He had a drug eluting stent placed in the proximal RCA in 2009 and drug eluting stents placed in the LAD and OM in 2011. He has chronic issues with diarrhea due to his Crohns disease. Stress myoview October 2016 with no evidence of ischemia.  He has been off of a statin because of joint aches. He has had issues with shoulder joint pain and back pain and this is being followed at [facility]. Seeing ortho and pain specialist at [facility]. Abnormal vertebral artery noted on MRI neck at [facility].    He is here today for follow up. The patient denies any chest pain, dyspnea, palpitations, lower extremity edema, orthopnea, PND, dizziness, near syncope or syncope. He is working out several days per week. He is limited somewhat by his shoulder pain. He sawed up several trees last week. He is still having GI issues.   Primary Care Physician: Alain Honey, MD   Past Medical History:  Diagnosis Date  . Coronary artery disease    DES placed in proximal RCA August 2009 and drug eluting stents in the LAD and the OM in February 2011  . Crohn's disease (HCC)   . Diabetes mellitus, type 2 (HCC)   . GERD (gastroesophageal reflux disease)   . Hiatal hernia   . Hypertension     Past Surgical History:  Procedure Laterality Date  . CARPAL TUNNEL RELEASE    . CHOLECYSTECTOMY    . laporoscopic knee surgery      Current Outpatient Medications  Medication Sig Dispense Refill  . aspirin (ASPIR-81) 81 MG EC tablet Take 81 mg by mouth daily.      . clobetasol cream (TEMOVATE) 0.05 % Apply 1 application topically 2 (two) times daily as needed. (psorasis)    . mesalamine (APRISO) 0.375 g 24 hr capsule Take 4 capsules by mouth daily.    . ONE  TOUCH ULTRA TEST test strip CHECK GLUCOSE 3 TIMES DAILY  4  . TESTOSTERONE IM Inject into the muscle.    . metoprolol succinate (TOPROL-XL) 50 MG 24 hr tablet Take 1 tablet (50 mg total) by mouth 2 (two) times daily. Take with or immediately following a meal. 180 tablet 3   No current facility-administered medications for this visit.     Allergies  Allergen Reactions  . Chlorthalidone     REACTION: dizzy  . Clopidogrel Bisulfate     REACTION: leg pain and fluid on knee  . Dicyclomine     REACTION: dizzy/bloated  . Doxycycline     REACTION: dizzy  . Metformin     REACTION: headache  . Niacin     REACTION: rash    Social History   Socioeconomic History  . Marital status: Divorced    Spouse name: Not on file  . Number of children: Not on file  . Years of education: Not on file  . Highest education level: Not on file  Occupational History  . Not on file  Social Needs  . Financial resource strain: Not on file  . Food insecurity:    Worry: Not on file    Inability: Not on file  . Transportation needs:    Medical: Not on file    Non-medical: Not  on file  Tobacco Use  . Smoking status: Former Smoker    Last attempt to quit: 06/24/1989    Years since quitting: 28.6  . Smokeless tobacco: Current User    Types: Chew  Substance and Sexual Activity  . Alcohol use: No  . Drug use: No  . Sexual activity: Not on file  Lifestyle  . Physical activity:    Days per week: Not on file    Minutes per session: Not on file  . Stress: Not on file  Relationships  . Social connections:    Talks on phone: Not on file    Gets together: Not on file    Attends religious service: Not on file    Active member of club or organization: Not on file    Attends meetings of clubs or organizations: Not on file    Relationship status: Not on file  . Intimate partner violence:    Fear of current or ex partner: Not on file    Emotionally abused: Not on file    Physically abused: Not on file     Forced sexual activity: Not on file  Other Topics Concern  . Not on file  Social History Narrative   Divorced, 2 children. Smoked 1-2ppd for 20 years but stopped in 1999.     Family History  Problem Relation Age of Onset  . Stroke Mother   . Hypertension Mother   . Hypertension Brother     Review of Systems:  As stated in the HPI and otherwise negative.   BP (!) 160/90   Pulse 94   Ht 5' 7.5" (1.715 m)   Wt 197 lb (89.4 kg)   BMI 30.40 kg/m   Physical Examination:  General: Well developed, well nourished, NAD  HEENT: OP clear, mucus membranes moist  SKIN: warm, dry. No rashes. Neuro: No focal deficits  Musculoskeletal: Muscle strength 5/5 all ext  Psychiatric: Mood and affect normal  Neck: No JVD, no carotid bruits, no thyromegaly, no lymphadenopathy.  Lungs:Clear bilaterally, no wheezes, rhonci, crackles Cardiovascular: Regular rate and rhythm. No murmurs, gallops or rubs. Abdomen:Soft. Bowel sounds present. Non-tender.  Extremities: No lower extremity edema. Pulses are 2 + in the bilateral DP/PT.  EKG:  EKG is not ordered today. The ekg ordered today demonstrates   Recent Labs: 07/28/2017: BUN 17; Creatinine, Ser 1.03; Potassium 4.5; Sodium 140   Lipid Panel Followed in primary care     Wt Readings from Last 3 Encounters:  02/16/18 197 lb (89.4 kg)  07/28/17 208 lb 9.6 oz (94.6 kg)  11/27/16 206 lb 6.4 oz (93.6 kg)     Other studies Reviewed: Additional studies/ records that were reviewed today include: . Review of the above records demonstrates:    Assessment and Plan:   1. CAD without angina: No chest pain. He has multi-vessel CAD and has had previous stents placed in the LAD, RCA and OM. Last stents in 2011. Continue ASA and beta blocker. He is intolerant of statins.     2. HYPERTENSION: BP is elevated today. Will increase Toprol to 50 mg po BID.    3. HYPERLIPIDEMIA: He is statin intolerant. He remains unwilling to try Praluent or Repatha  injections.      4. Vertebral artery disease: Noted on MRI of his neck at [facility] in January 2019. He asked me to schedule the neck CTA in February 2019 here to further evaluate. This showed occlusion of the right vertebral artery at the origin with reconstitution  at the dural margin via retrograde flow from the left. Mild carotid artery disease.   5. Carotid artery disease: Mild by neck CTA February 2019. Will need carotid artery dopplers in 2022.   Current medicines are reviewed at length with the patient today.  The patient does not have concerns regarding medicines.  The following changes have been made:  no change  Labs/ tests ordered today include:   No orders of the defined types were placed in this encounter.   Disposition:   FU with me in 6 months   Signed, Verne Carrow, MD 02/16/2018 11:25 AM    [facility] [address] [place], [place], [place]  [address] Phone: [phone]; Fax: [phone]

## 2018-02-16 NOTE — Patient Instructions (Signed)
Medication Instructions:  Your physician has recommended you make the following change in your medication:  Increase Toprol to 50 mg by mouth twice daily.   Labwork: none  Testing/Procedures: none  Follow-Up: Your physician recommends that you schedule a follow-up appointment in: 6 months. Please call our office in about 2 months to schedule this appointment    Any Other Special Instructions Will Be Listed Below (If Applicable).     If you need a refill on your cardiac medications before your next appointment, please call your pharmacy.

## 2018-09-02 ENCOUNTER — Other Ambulatory Visit: Payer: Self-pay

## 2018-09-02 ENCOUNTER — Encounter: Payer: Self-pay | Admitting: Cardiovascular Disease

## 2018-09-02 ENCOUNTER — Ambulatory Visit (INDEPENDENT_AMBULATORY_CARE_PROVIDER_SITE_OTHER): Payer: MEDICARE | Admitting: Cardiovascular Disease

## 2018-09-02 VITALS — BP 154/86 | HR 82 | Ht 67.5 in | Wt 195.4 lb

## 2018-09-02 DIAGNOSIS — E7800 Pure hypercholesterolemia, unspecified: Secondary | ICD-10-CM

## 2018-09-02 DIAGNOSIS — I739 Peripheral vascular disease, unspecified: Secondary | ICD-10-CM

## 2018-09-02 DIAGNOSIS — I2510 Atherosclerotic heart disease of native coronary artery without angina pectoris: Secondary | ICD-10-CM | POA: Diagnosis not present

## 2018-09-02 DIAGNOSIS — I10 Essential (primary) hypertension: Secondary | ICD-10-CM

## 2018-09-02 DIAGNOSIS — I779 Disorder of arteries and arterioles, unspecified: Secondary | ICD-10-CM

## 2018-09-02 NOTE — Patient Instructions (Signed)

## 2018-09-02 NOTE — Progress Notes (Signed)
Chief Complaint  Patient presents with  . Follow-up    CAD   History of Present Illness: 68 yo male with history of CAD, HTN, hyperlipidemia, Crohns disease, DM, hiatal hernia, vertebral artery disease and carotid artery disease  here today for cardiac follow up. He had a drug eluting stent placed in the proximal RCA in 2009 and drug eluting stents placed in the LAD and OM in 2011. He has chronic issues with diarrhea due to his Crohns disease. Stress myoview October 2016 with no evidence of ischemia.  He has been off of a statin because of joint aches. He has had issues with shoulder joint pain and back pain and this is being followed at [facility]. He is seeing ortho and pain specialists at [facility]. Abnormal vertebral artery noted on MRI neck at [facility]. He asked me to schedule the neck CTA in February 2019 here to further evaluate. 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 Mild carotid artery disease.    He is here today for follow up. The patient denies any exertional chest pain, dyspnea, palpitations, lower extremity edema, orthopnea, PND, dizziness, near syncope or syncope. He has chronic back and cervical disc pain, right flank pain and abdominal pain/diarrhea. Occasional resting chest pain in the same spot for years. No associated dyspnea.   Primary Care Physician: Alain Honey, MD  Past Medical History:  Diagnosis Date  . Coronary artery disease    DES placed in proximal RCA August 2009 and drug eluting stents in the LAD and the OM in February 2011  . Crohn's disease (HCC)   . Diabetes mellitus, type 2 (HCC)   . GERD (gastroesophageal reflux disease)   . Hiatal hernia   . Hypertension     Past Surgical History:  Procedure Laterality Date  . CARPAL TUNNEL RELEASE    . CHOLECYSTECTOMY    . laporoscopic knee surgery      Current Outpatient Medications  Medication Sig  Dispense Refill  . aspirin (ASPIR-81) 81 MG EC tablet Take 81 mg by mouth daily.      . clobetasol cream (TEMOVATE) 0.05 % Apply 1 application topically 2 (two) times daily as needed. (psorasis)    . diphenoxylate-atropine (LOMOTIL) 2.5-0.025 MG tablet Take 1 tablet by mouth 3 (three) times daily as needed.    . metoprolol succinate (TOPROL-XL) 50 MG 24 hr tablet Take 1 tablet (50 mg total) by mouth 2 (two) times daily. Take with or immediately following a meal. 180 tablet 3  . ONE TOUCH ULTRA TEST test strip CHECK GLUCOSE 3 TIMES DAILY  4  . TESTOSTERONE IM Inject into the muscle.     No current facility-administered medications for this visit.     Allergies  Allergen Reactions  . Chlorthalidone     REACTION: dizzy  . Clopidogrel Bisulfate     REACTION: leg pain and fluid on knee  . Dicyclomine     REACTION: dizzy/bloated  . Doxycycline     REACTION: dizzy  . Metformin     REACTION: headache  . Niacin     REACTION: rash    Social History   Socioeconomic History  . Marital status: Divorced    Spouse name: Not on file  . Number of children: Not on file  . Years of education: Not on file  . Highest education level: Not on file  Occupational History  . Not  on file  Social Needs  . Financial resource strain: Not on file  . Food insecurity:    Worry: Not on file    Inability: Not on file  . Transportation needs:    Medical: Not on file    Non-medical: Not on file  Tobacco Use  . Smoking status: Former Smoker    Last attempt to quit: 06/24/1989    Years since quitting: 29.2  . Smokeless tobacco: Current User    Types: Chew  Substance and Sexual Activity  . Alcohol use: No  . Drug use: No  . Sexual activity: Not on file  Lifestyle  . Physical activity:    Days per week: Not on file    Minutes per session: Not on file  . Stress: Not on file  Relationships  . Social connections:    Talks on phone: Not on file    Gets together: Not on file    Attends religious  service: Not on file    Active member of club or organization: Not on file    Attends meetings of clubs or organizations: Not on file    Relationship status: Not on file  . Intimate partner violence:    Fear of current or ex partner: Not on file    Emotionally abused: Not on file    Physically abused: Not on file    Forced sexual activity: Not on file  Other Topics Concern  . Not on file  Social History Narrative   Divorced, 2 children. Smoked 1-2ppd for 20 years but stopped in 1999.     Family History  Problem Relation Age of Onset  . Stroke Mother   . Hypertension Mother   . Hypertension Brother     Review of Systems:  As stated in the HPI and otherwise negative.   BP (!) 154/86   Pulse 82   Ht 5' 7.5" (1.715 m)   Wt 195 lb 6.4 oz (88.6 kg)   SpO2 98%   BMI 30.15 kg/m   Physical Examination:  General: Well developed, well nourished, NAD  HEENT: OP clear, mucus membranes moist  SKIN: warm, dry. No rashes. Neuro: No focal deficits  Musculoskeletal: Muscle strength 5/5 all ext  Psychiatric: Mood and affect normal  Neck: No JVD, no carotid bruits, no thyromegaly, no lymphadenopathy.  Lungs:Clear bilaterally, no wheezes, rhonci, crackles Cardiovascular: Regular rate and rhythm. No murmurs, gallops or rubs. Abdomen:Soft. Bowel sounds present. Non-tender.  Extremities: No lower extremity edema. Pulses are 2 + in the bilateral DP/PT.  EKG:  EKG is ordered today. The ekg ordered today demonstrates NSR, rate 82 bpm  Recent Labs: No results found for requested labs within last 8760 hours.   Lipid Panel Followed in primary care     Wt Readings from Last 3 Encounters:  09/02/18 195 lb 6.4 oz (88.6 kg)  02/16/18 197 lb (89.4 kg)  07/28/17 208 lb 9.6 oz (94.6 kg)     Other studies Reviewed: Additional studies/ records that were reviewed today include: . Review of the above records demonstrates:    Assessment and Plan:   1. CAD without angina: He has  multi-vessel CAD and has had previous stents placed in the LAD, RCA and OM. Last stents in 2011. He has no chest pain. Will continue ASA and beta blocker. He is intolerant of statins.     2. HYPERTENSION: BP is elevated today but has been well controlled at home. He admits to heavy intake of caffeine this am.  3. HYPERLIPIDEMIA: He is statin intolerant. He remains unwilling to try Praluent or Repatha injections.      4. Vertebral artery disease: Noted on MRI of his neck at [facility] in January 2019. He asked me to schedule the neck CTA in February 2019 here to further evaluate. 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 Mild carotid artery disease.   5. Carotid artery disease: Mild by neck CTA February 2019. Will repeat carotid artery dopplers in 2022.   Current medicines are reviewed at length with the patient today.  The patient does not have concerns regarding medicines.  The following changes have been made:  no change  Labs/ tests ordered today include:   Orders Placed This Encounter  Procedures  . EKG 12-Lead    Disposition:   FU with me in 12 months   Signed, Verne Carrow, MD 09/02/2018 11:55 AM    [facility] [address] [place], [place], [place]  [address] Phone: [phone]; Fax: [phone]

## 2018-12-04 IMAGING — CT CT ANGIO NECK
2 of 11 series · 14 of 46 positions shown, 16 images · IV contrast (iopamidol)
Comparison: None.

CLINICAL DATA: Right vertebral artery stenosis.

EXAM:
CT ANGIOGRAPHY NECK
TECHNIQUE: Multidetector CT imaging of the neck was performed using the
standard protocol during bolus administration of intravenous
contrast. Multiplanar CT image reconstructions and MIPs were
obtained to evaluate the vascular anatomy. Carotid stenosis
measurements (when applicable) are obtained utilizing NASCET
criteria, using the distal internal carotid diameter as the
denominator.
CONTRAST:  80mL FSGVVD-DFX IOPAMIDOL (FSGVVD-DFX) INJECTION 76%

[Series 6: thin axial · axial · 0.49mm/px · z∈[-226,-36]mm · 11 of 228 slices shown, 13 images]
[im 19/228  soft-tissue]
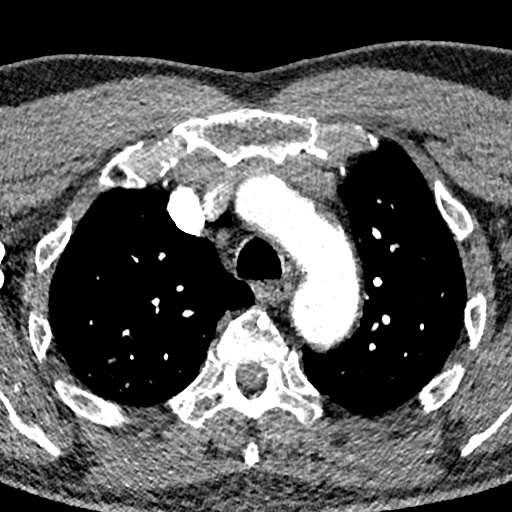
[im 19/228  bone]
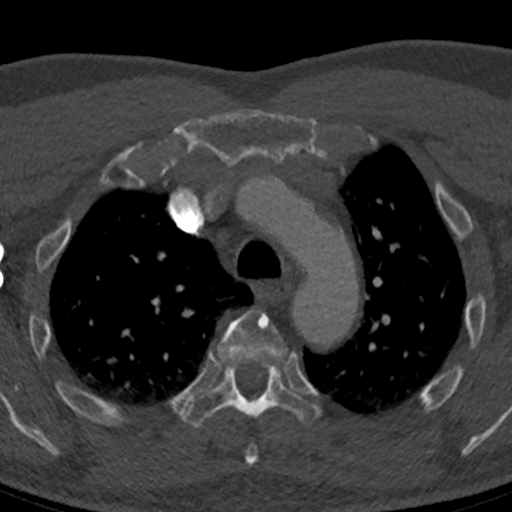
[im 38/228  soft-tissue]
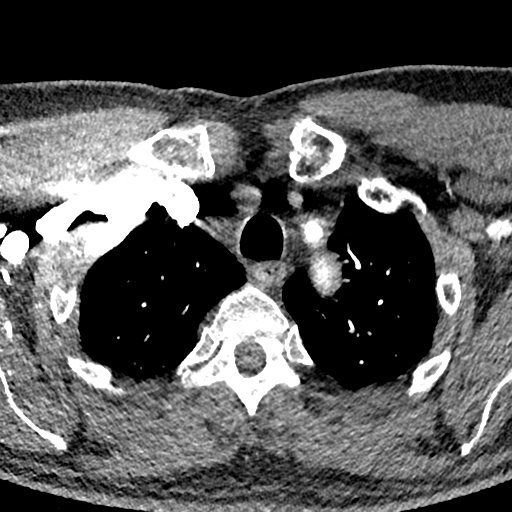
[im 57/228  soft-tissue]
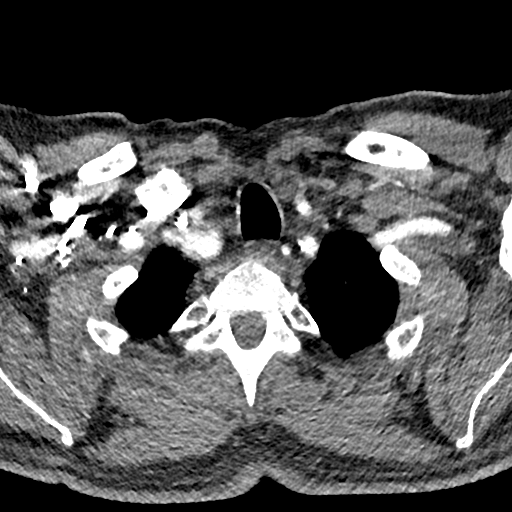
[im 76/228  soft-tissue]
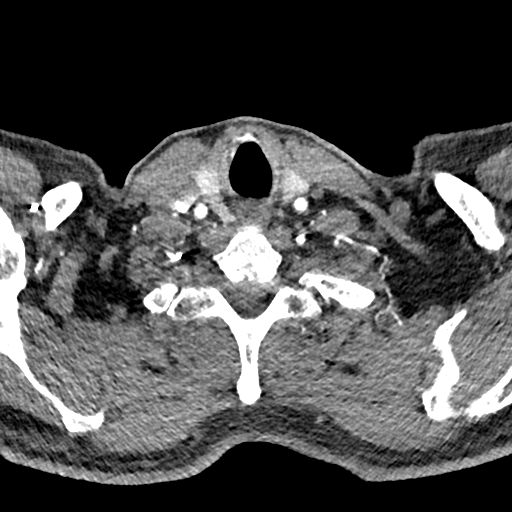
[im 95/228  soft-tissue]
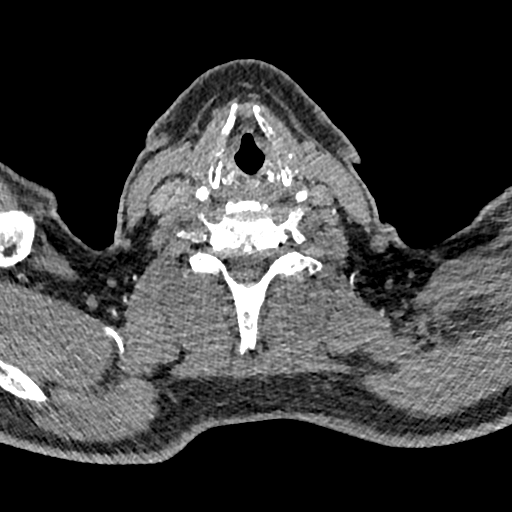
[im 114/228  soft-tissue]
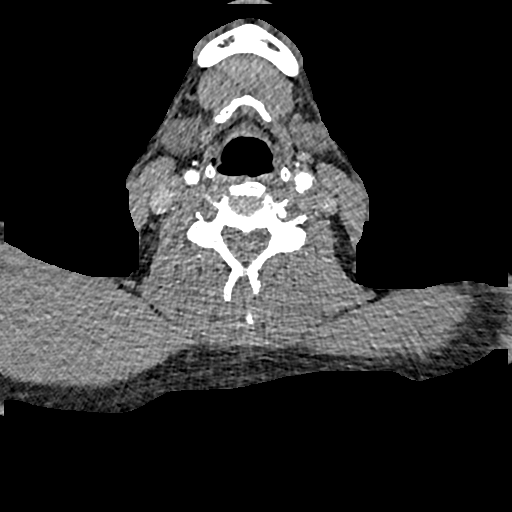
[im 133/228  soft-tissue]
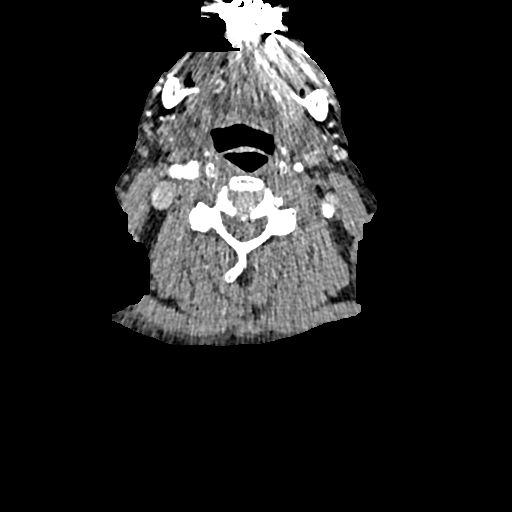
[im 152/228  soft-tissue]
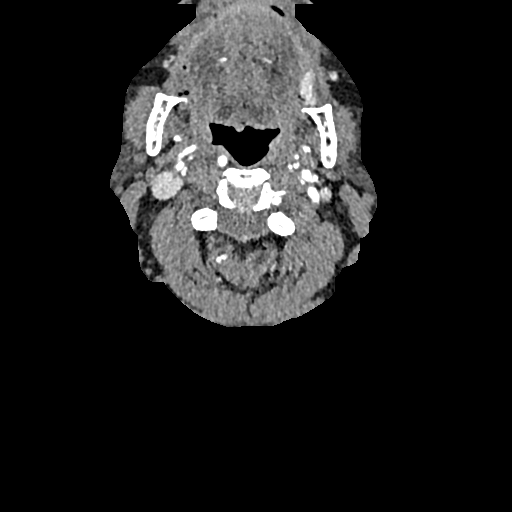
[im 171/228  soft-tissue]
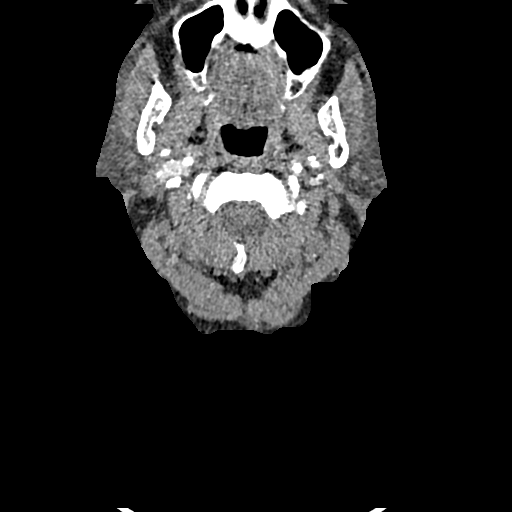
[im 171/228  bone]
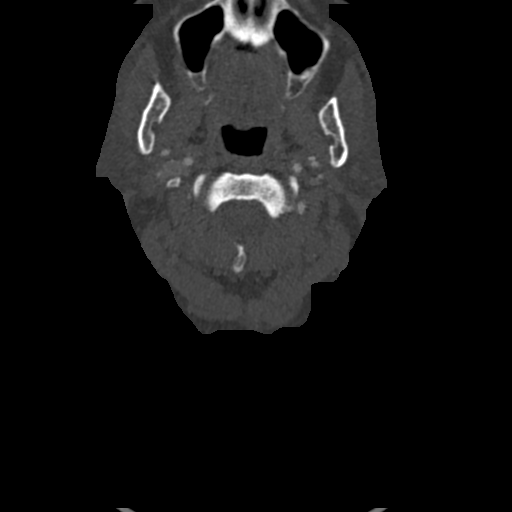
[im 190/228  soft-tissue]
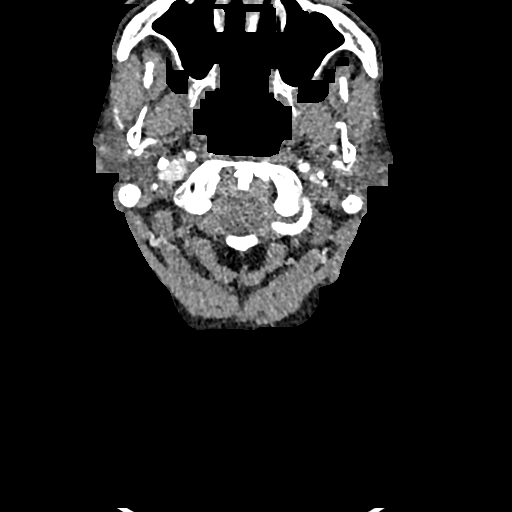
[im 209/228  soft-tissue]
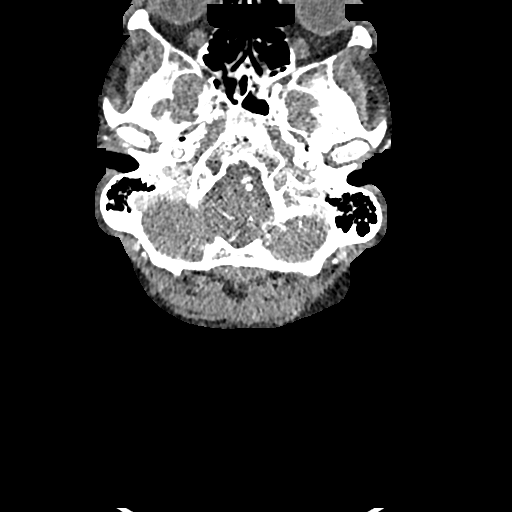

[Series 8: coronal thin · coronal · 0.45mm/px · 3 of 227 slices shown]
[im 65/227  soft-tissue]
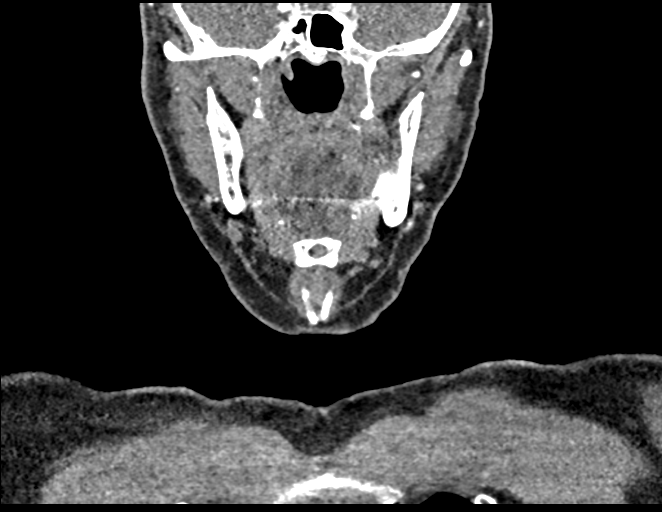
[im 97/227  soft-tissue]
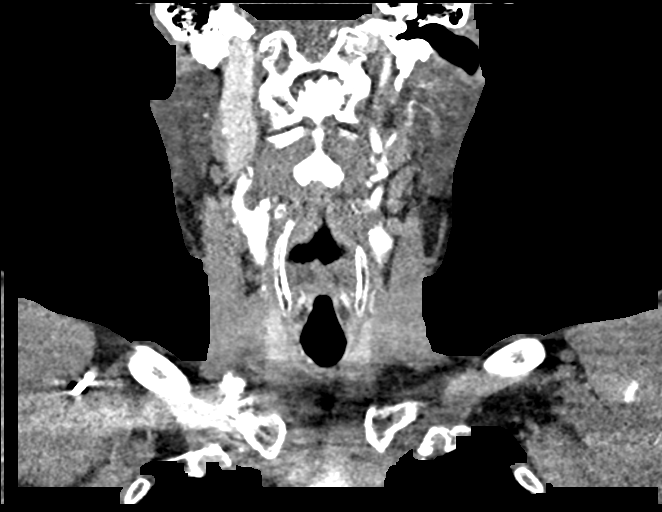
[im 130/227  soft-tissue]
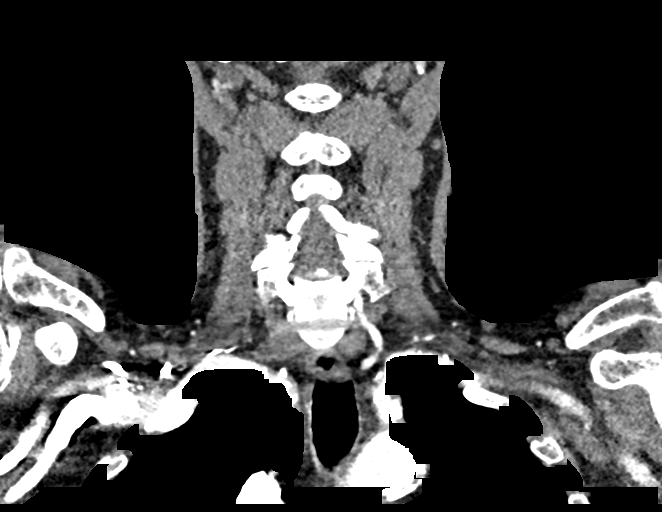

[14 of 46 positions shown; findings below may reference images not displayed]

FINDINGS: Aortic arch: There is a common origin of the left common carotid
artery in the innominate artery. Atherosclerotic changes are present
at the aortic arch without aneurysm or stenosis.

Right carotid system: The right common carotid artery demonstrates
atherosclerotic changes in the distal vessel. Atherosclerotic
changes are present at the carotid bifurcation without significant
stenosis. Mild tortuosity is present in the cervical left ICA.

Left carotid system: Atherosclerotic changes are present in the
distal left common carotid artery through the ICA bifurcation
without a significant stenosis relative to the more distal vessel.
There is mild tortuosity of the cervical ICA.

Vertebral arteries: The left vertebral artery originates from the
subclavian artery without significant stenosis. Is within normal
limits through the neck. The right vertebral artery is occluded. The
foramen transversarium is of normal size. The vessel is
reconstituted at the dural margin. The PICA arteries fill normally
on both sides.

Skeleton: Vertebral body heights and alignment are normal.

Other neck: The soft tissues the neck are otherwise unremarkable. No
significant adenopathy is present. Thyroid is normal. Salivary
glands are within normal limits.

Upper chest: Centrilobular emphysema is present bilaterally. There
is mild dependent atelectasis bilaterally.
IMPRESSION: 1. The right vertebral artery is occluded at its origin.
2. The right vertebral artery is reconstituted at the dural margin,
presumably via retrograde flow from the left vertebral artery.
3. Mild atherosclerotic changes within the distal common carotid
artery and carotid bifurcation bilaterally without significant
stenosis.

## 2019-01-16 ENCOUNTER — Other Ambulatory Visit: Payer: Self-pay | Admitting: Cardiovascular Disease

## 2019-02-15 ENCOUNTER — Telehealth: Payer: Self-pay | Admitting: Cardiovascular Disease

## 2019-02-15 NOTE — Telephone Encounter (Signed)
Called pt and left message informing pt that his medication Metoprolol succinate 50 mg tablet was sent to his requested pharmacy [facility] on 01/18/19 with a year supply. I also called [facility] to confirm this and the tech clarified that they did have this and would get this medication shipped out to the pt.

## 2019-02-15 NOTE — Telephone Encounter (Signed)
New Message    *STAT* If patient is at the pharmacy, call can be transferred to refill team.   1. Which medications need to be refilled? (please list name of each medication and dose if known) metoprolol succinate (TOPROL-XL) 50 MG 24 hr tablet    2. Which pharmacy/location (including street and city if local pharmacy) is medication to be sent to? [facility] [place]  3. Do they need a 30 day or 90 day supply? [place]

## 2019-03-09 NOTE — Progress Notes (Signed)
Chief Complaint  Patient presents with  . Follow-up    CAD   History of Present Illness: 68 yo male with history of CAD, HTN, hyperlipidemia, Crohns disease, DM, hiatal hernia, vertebral artery disease and carotid artery disease  here today for cardiac follow up. He had a drug eluting stent placed in the proximal RCA in 2009 and drug eluting stents placed in the LAD and OM in 2011. He has chronic issues with diarrhea due to his Crohns disease. Stress myoview October 2016 with no evidence of ischemia.  He has been off of a statin because of joint aches. He has had issues with shoulder joint pain and back pain and this is being [facility]. He is seeing [facility] and pain [facility]. Abnormal vertebral artery [facility]. He asked me to schedule the neck CTA in February 2019 here to further evaluate. 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 Mild carotid artery disease. He is now on Remicade infusions for his Crohn's disease.    He is here today for follow up. The patient denies any chest pain, dyspnea, palpitations, lower extremity edema, orthopnea, PND, dizziness, near syncope or syncope. He has some fatigue. Worsened since starting Remicade infusions. No exertional chest pain.   Primary Care Physician: Alain HoneyHerber, Richard, MD  Past Medical History:  Diagnosis Date  . Coronary artery disease    DES placed in proximal RCA August 2009 and drug eluting stents in the LAD and the OM in February 2011  . Crohn's disease (HCC)   . Diabetes mellitus, type 2 (HCC)   . GERD (gastroesophageal reflux disease)   . Hiatal hernia   . Hypertension     Past Surgical History:  Procedure Laterality Date  . CARPAL TUNNEL RELEASE    . CHOLECYSTECTOMY    . laporoscopic knee surgery      Current Outpatient Medications  Medication Sig Dispense Refill  . aspirin  (ASPIR-81) 81 MG EC tablet Take 81 mg by mouth daily.      . clobetasol cream (TEMOVATE) 0.05 % Apply 1 application topically 2 (two) times daily as needed. (psorasis)    . diphenoxylate-atropine (LOMOTIL) 2.5-0.025 MG tablet Take 1 tablet by mouth 3 (three) times daily as needed.    . inFLIXimab (REMICADE) 100 MG injection Inject 100 mg into the vein as directed.    . metoprolol succinate (TOPROL-XL) 50 MG 24 hr tablet TAKE 1 TABLET BY MOUTH 2  TIMES DAILY. TAKE WITH OR  IMMEDIATELY FOLLOWING A  MEAL. 180 tablet 3  . ONE TOUCH ULTRA TEST test strip CHECK GLUCOSE 3 TIMES DAILY  4  . TESTOSTERONE IM Inject into the muscle.     No current facility-administered medications for this visit.     Allergies  Allergen Reactions  . Chlorthalidone     REACTION: dizzy  . Clopidogrel Bisulfate     REACTION: leg pain and fluid on knee  . Dicyclomine     REACTION: dizzy/bloated  . Doxycycline     REACTION: dizzy  . Metformin     REACTION: headache  . Niacin     REACTION: rash    Social History   Socioeconomic History  . Marital status: Divorced    Spouse name: Not on file  . Number of children: Not on file  . Years of education: Not on file  . Highest  education level: Not on file  Occupational History  . Not on file  Social Needs  . Financial resource strain: Not on file  . Food insecurity    Worry: Not on file    Inability: Not on file  . Transportation needs    Medical: Not on file    Non-medical: Not on file  Tobacco Use  . Smoking status: Former Smoker    Quit date: 06/24/1989    Years since quitting: 29.7  . Smokeless tobacco: Current User    Types: Chew  Substance and Sexual Activity  . Alcohol use: No  . Drug use: No  . Sexual activity: Not on file  Lifestyle  . Physical activity    Days per week: Not on file    Minutes per session: Not on file  . Stress: Not on file  Relationships  . Social Musicianconnections    Talks on phone: Not on file    Gets together: Not on file     Attends religious service: Not on file    Active member of club or organization: Not on file    Attends meetings of clubs or organizations: Not on file    Relationship status: Not on file  . Intimate partner violence    Fear of current or ex partner: Not on file    Emotionally abused: Not on file    Physically abused: Not on file    Forced sexual activity: Not on file  Other Topics Concern  . Not on file  Social History Narrative   Divorced, 2 children. Smoked 1-2ppd for 20 years but stopped in 1999.     Family History  Problem Relation Age of Onset  . Stroke Mother   . Hypertension Mother   . Hypertension Brother     Review of Systems:  As stated in the HPI and otherwise negative.   BP (!) 172/70   Pulse 65   Wt 193 lb (87.5 kg)   SpO2 97%   BMI 29.78 kg/m   Physical Examination:  General: Well developed, well nourished, NAD  HEENT: OP clear, mucus membranes moist  SKIN: warm, dry. No rashes. Neuro: No focal deficits  Musculoskeletal: Muscle strength 5/5 all ext  Psychiatric: Mood and affect normal  Neck: No JVD, no carotid bruits, no thyromegaly, no lymphadenopathy.  Lungs:Clear bilaterally, no wheezes, rhonci, crackles Cardiovascular: Regular rate and rhythm. No murmurs, gallops or rubs. Abdomen:Soft. Bowel sounds present. Non-tender.  Extremities: No lower extremity edema. Pulses are 2 + in the bilateral DP/PT.  EKG:  EKG is not ordered today. The ekg ordered today demonstrates   Recent Labs: No results found for requested labs within last 8760 hours.   Lipid Panel Followed in primary care     Wt Readings from Last 3 Encounters:  03/10/19 193 lb (87.5 kg)  09/02/18 195 lb 6.4 oz (88.6 kg)  02/16/18 197 lb (89.4 kg)     Other studies Reviewed: Additional studies/ records that were reviewed today include: . Review of the above records demonstrates:    Assessment and Plan:   1. CAD without angina: He has multi-vessel CAD and has had previous  stents placed in the LAD, RCA and OM. Last stents in 2011. He has no exertional chest pain. He is intolerant of statins.   Will continue ASA and beta blocker. Will arrange an echo to assess LV function.   2. HYPERTENSION: BP is well controlled at home. He is upset today due to issues with his  mask and trouble at home  3. HYPERLIPIDEMIA: He is statin intolerant. He remains unwilling to try Praluent or Repatha injections.      4. Vertebral artery disease: Noted on MRI of his neck at [facility] in January 2019. He asked me to schedule the neck CTA in February 2019 here to further evaluate. 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 Mild carotid artery disease.   5. Carotid artery disease: Mild by neck CTA February 2019. Will repeat carotid artery dopplers in 2022.   Current medicines are reviewed at length with the patient today.  The patient does not have concerns regarding medicines.  The following changes have been made:  no change  Labs/ tests ordered today include:   Orders Placed This Encounter  Procedures  . ECHOCARDIOGRAM COMPLETE    Disposition:   FU with me in 12 months   Signed, Lauree Chandler, MD 03/10/2019 12:23 PM    [facility] [address], [place], [place]  [address] Phone: [phone]; Fax: [phone]

## 2019-03-10 ENCOUNTER — Ambulatory Visit (INDEPENDENT_AMBULATORY_CARE_PROVIDER_SITE_OTHER): Payer: MEDICARE | Admitting: Cardiovascular Disease

## 2019-03-10 ENCOUNTER — Other Ambulatory Visit: Payer: Self-pay

## 2019-03-10 ENCOUNTER — Encounter (INDEPENDENT_AMBULATORY_CARE_PROVIDER_SITE_OTHER): Payer: Self-pay

## 2019-03-10 ENCOUNTER — Encounter: Payer: Self-pay | Admitting: Cardiovascular Disease

## 2019-03-10 VITALS — BP 172/70 | HR 65 | Wt 193.0 lb

## 2019-03-10 DIAGNOSIS — I779 Disorder of arteries and arterioles, unspecified: Secondary | ICD-10-CM

## 2019-03-10 DIAGNOSIS — I10 Essential (primary) hypertension: Secondary | ICD-10-CM

## 2019-03-10 DIAGNOSIS — E7800 Pure hypercholesterolemia, unspecified: Secondary | ICD-10-CM

## 2019-03-10 DIAGNOSIS — I739 Peripheral vascular disease, unspecified: Secondary | ICD-10-CM | POA: Diagnosis not present

## 2019-03-10 DIAGNOSIS — I2510 Atherosclerotic heart disease of native coronary artery without angina pectoris: Secondary | ICD-10-CM

## 2019-03-10 NOTE — Patient Instructions (Signed)
Medication Instructions:  Your physician recommends that you continue on your current medications as directed. Please refer to the Current Medication list given to you today.  If you need a refill on your cardiac medications before your next appointment, please call your pharmacy.   Lab work: None If you have labs (blood work) drawn today and your tests are completely normal, you will receive your results only by: . MyChart Message (if you have MyChart) OR . A paper copy in the mail If you have any lab test that is abnormal or we need to change your treatment, we will call you to review the results.  Testing/Procedures: Your physician has requested that you have an echocardiogram. Echocardiography is a painless test that uses sound waves to create images of your heart. It provides your doctor with information about the size and shape of your heart and how well your heart's chambers and valves are working. This procedure takes approximately one hour. There are no restrictions for this procedure.    Follow-Up: At [facility], you and your health needs are our priority.  As part of our continuing mission to provide you with exceptional heart care, we have created designated Provider Care Teams.  These Care Teams include your primary Cardiologist (physician) and Advanced Practice Providers (APPs -  Physician Assistants and Nurse Practitioners) who all work together to provide you with the care you need, when you need it. You will need a follow up appointment in 12 months.  Please call our office 2 months in advance to schedule this appointment.  You may see Christopher McAlhany, MD or one of the following Advanced Practice Providers on your designated Care Team:   Brittainy Simmons, PA-C Dayna Dunn, PA-C . Michele Lenze, PA-C  Any Other Special Instructions Will Be Listed Below (If Applicable).    

## 2019-03-18 ENCOUNTER — Ambulatory Visit (HOSPITAL_COMMUNITY): Payer: MEDICARE | Attending: Internal Medicine

## 2019-03-18 ENCOUNTER — Other Ambulatory Visit: Payer: Self-pay

## 2019-03-18 DIAGNOSIS — I2510 Atherosclerotic heart disease of native coronary artery without angina pectoris: Secondary | ICD-10-CM | POA: Diagnosis present

## 2019-07-09 ENCOUNTER — Telehealth: Payer: Self-pay | Admitting: Cardiovascular Disease

## 2019-07-09 NOTE — Telephone Encounter (Signed)
I spoke to the patient who called concerned with his BP over the past few months.  It has been fluctuating between 158/80-192/91 HR stable in the 70s.  He experiences headaches when the BP is elevated.  He takes Metoprolol Succinate 50 mg bid.    He has been under a lot of stress lately with his mother's illness and dog's sickness and is figuring this is playing a part.  He also mentioned that he gets Remicade injections every 6 weeks and has noticed the BP is elevated during the treatments.  His next injection is scheduled 07/27/19.   He denies SOB or CP.  He will monitor over the weekend, checking his BP a couple of hours after taking medication and call Monday with an update.

## 2019-07-09 NOTE — Telephone Encounter (Signed)
Thanks

## 2019-07-09 NOTE — Telephone Encounter (Signed)
Pt c/o BP issue: STAT if pt c/o blurred vision, one-sided weakness or slurred speech  1. What are your last 5 BP readings? 212/94 hr 72, 158/80 hr 70, 179/83 hr 73, 180/86 79, 192/91 hr 83  2. Are you having any other symptoms (ex. Dizziness, headache, blurred vision, passed out)? Dizzy when BP is high  3. What is your BP issue? Patient states his BP was been very high. He is unsure if it is because of he has been under a lot of stress or if it's his medication. Patient would like a call back for advice.

## 2019-07-12 NOTE — Telephone Encounter (Signed)
Called patient back about his message. Informed patient that with his symptoms he should go to the ED. Patient stated he did not pass out or black out, but felt like he was going to. Patient stated his neck pain is probably due to his chronic back issues. Patient stated he has been taking an extra half a dose of his Metoprolol at night, so he is taking metoprolol 50 mg in the AM and 75 mg in the PM. Patient stated his BP is fine in the mornings (SBP 150's) then he gets up and moving during the day and his BP just keeps going up highest at 204/86 HR 82.  Some of his readings over the weekend were, BP 188/88 HR 76, BP 169/85 HR 72, BP 189/88 HR 69.  Patient recently took metoprolol. Encouraged patient to give it about an hour and check his BP again. If his BP has not come down he should call our office to the on call doctor for advisement. Informed patient also to go the ED or call 911 if his symptoms got worse. Patient stated he had a lot of stressors going on in his life right now, encouraged patient to try some relaxing techniques for the time being (deep breathing, stretching, praying, etc). Patient stated he would just like a medication to decrease his BP, but he is worried about his HR getting too low. Informed patient that a message would be sent to Dr. Clifton James for further advisement. Encouraged patient to possibly go to urgent care if he would not go to ED, especially with his medical history and family history of CAD and strokes. Patient verbalized understanding and stated he might go to urgent care.

## 2019-07-12 NOTE — Telephone Encounter (Signed)
Follow Up  Patient is calling back in still having issues with his blood pressure being increased. States that he is having dizziness, pain in his neck, sweating, feeling like he is going to pass out, and feeling like he is going to black out. Blood pressure is currently 204/86 HR 82 with some dizziness. Scheduled patient for an appointment with Dr. Clifton James on 07/14/2019 at 2:40 via patient's request. Patient states that BP and HR are normal in the morning, but once he gets up and moving around he begins to have all the symptoms and the increase in his BP. Please give patient a call back to advise.

## 2019-07-13 MED ORDER — AMLODIPINE BESYLATE 5 MG PO TABS: 5.0000 mg | ORAL_TABLET | Freq: Every day | ORAL | 3 refills | Status: DC

## 2019-07-13 NOTE — Telephone Encounter (Signed)
Patient agrees to try amlodipine 5 mg daily. He will continue to monitor BP. He has an appointment with Dr. Clifton James tomorrow and will make sure he takes all his meds before he comes. He was grateful for assistance.

## 2019-07-13 NOTE — Telephone Encounter (Signed)
Can we check with him and see if he would be willing to try adding another medication for his BP? We could try Norvasc 5 mg daily if he is willing. This will not affect his heart rate. Thayer Ohm

## 2019-07-14 ENCOUNTER — Other Ambulatory Visit: Payer: Self-pay

## 2019-07-14 ENCOUNTER — Encounter: Payer: Self-pay | Admitting: Cardiovascular Disease

## 2019-07-14 ENCOUNTER — Ambulatory Visit (INDEPENDENT_AMBULATORY_CARE_PROVIDER_SITE_OTHER): Payer: MEDICARE | Admitting: Cardiovascular Disease

## 2019-07-14 VITALS — BP 178/96 | HR 84 | Ht 67.5 in | Wt 200.8 lb

## 2019-07-14 DIAGNOSIS — I779 Disorder of arteries and arterioles, unspecified: Secondary | ICD-10-CM | POA: Diagnosis not present

## 2019-07-14 DIAGNOSIS — I2510 Atherosclerotic heart disease of native coronary artery without angina pectoris: Secondary | ICD-10-CM

## 2019-07-14 DIAGNOSIS — E7800 Pure hypercholesterolemia, unspecified: Secondary | ICD-10-CM

## 2019-07-14 DIAGNOSIS — I10 Essential (primary) hypertension: Secondary | ICD-10-CM | POA: Diagnosis not present

## 2019-07-14 NOTE — Patient Instructions (Signed)
Medication Instructions:  Your physician has recommended you make the following change in your medication:  1.) continue amlodipine 5 mg for about 1 more week and if blood pressure readings are still greater than 140/ increase amlodipine to 10 mg daily.    Lab Work: none  Testing/Procedures: None  Follow-Up: At [facility], you and your health needs are our priority.  As part of our continuing mission to provide you with exceptional heart care, we have created designated Provider Care Teams.  These Care Teams include your primary Cardiologist (physician) and Advanced Practice Providers (APPs -  Physician Assistants and Nurse Practitioners) who all work together to provide you with the care you need, when you need it.  Your next appointment:   4-6 weeks The format for your next appointment:   In Person  Provider:   Verne Carrow, MD  Other Instructions

## 2019-07-14 NOTE — Progress Notes (Signed)
Chief Complaint  Patient presents with  . Follow-up    CAD   History of Present Illness: 69 yo male with history of CAD, HTN, hyperlipidemia, Crohns disease, DM, hiatal hernia, vertebral artery disease and carotid artery disease  here today for cardiac follow up. He had a drug eluting stent placed in the proximal RCA in 2009 and drug eluting stents placed in the LAD and OM in 2011. He has chronic issues with diarrhea due to his Crohns disease. Stress myoview October 2016 with no evidence of ischemia.  He has been off of a statin because of joint aches. He has had issues with shoulder joint pain and back pain and this is being followed at [facility]. He is seeing ortho and pain specialists at [facility]. Abnormal vertebral artery noted on MRI neck at [facility]. He asked me to schedule the neck CTA in February 2019 here to further evaluate. 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 Mild carotid artery disease. He is now on Remicade infusions for his Crohn's disease. Echo September 2020 with LVEF=60-65%. Moderate LVH. Mild MR. Trace AI.    He is here today for follow up. He has many complaints today including headache, sinus fullness, lightheadedness, joint aches, muscle pains, GI upset. No chest pain or dyspnea, No palpitations, lower extremity edema, orthopnea, PND. He has some dizziness but no syncope. He started Norvasc 5 mg yesterday.   Primary Care Physician: Alain Honey, MD  Past Medical History:  Diagnosis Date  . Coronary artery disease    DES placed in proximal RCA August 2009 and drug eluting stents in the LAD and the OM in February 2011  . Crohn's disease (HCC)   . Diabetes mellitus, type 2 (HCC)   . GERD (gastroesophageal reflux disease)   . Hiatal hernia   . Hypertension     Past Surgical History:  Procedure Laterality Date  . CARPAL TUNNEL RELEASE    . CHOLECYSTECTOMY     . laporoscopic knee surgery      Current Outpatient Medications  Medication Sig Dispense Refill  . amLODipine (NORVASC) 5 MG tablet Take 1 tablet (5 mg total) by mouth daily. 90 tablet 3  . aspirin (ASPIR-81) 81 MG EC tablet Take 81 mg by mouth daily.      . clobetasol cream (TEMOVATE) 0.05 % Apply 1 application topically 2 (two) times daily as needed. (psorasis)    . inFLIXimab (REMICADE) 100 MG injection Inject 100 mg into the vein as directed.    . metoprolol succinate (TOPROL-XL) 50 MG 24 hr tablet TAKE 1 TABLET BY MOUTH 2  TIMES DAILY. TAKE WITH OR  IMMEDIATELY FOLLOWING A  MEAL. 180 tablet 3  . ONE TOUCH ULTRA TEST test strip CHECK GLUCOSE 3 TIMES DAILY  4  . TESTOSTERONE IM Inject into the muscle.     No current facility-administered medications for this visit.    Allergies  Allergen Reactions  . Chlorthalidone     REACTION: dizzy  . Clopidogrel Bisulfate     REACTION: leg pain and fluid on knee  . Dicyclomine     REACTION: dizzy/bloated  . Doxycycline     REACTION: dizzy  . Metformin     REACTION: headache  . Niacin     REACTION: rash    Social History   Socioeconomic History  . Marital status: Divorced    Spouse name: Not on file  .  Number of children: Not on file  . Years of education: Not on file  . Highest education level: Not on file  Occupational History  . Not on file  Tobacco Use  . Smoking status: Former Smoker    Quit date: 06/24/1989    Years since quitting: 30.0  . Smokeless tobacco: Current User    Types: Chew  Substance and Sexual Activity  . Alcohol use: No  . Drug use: No  . Sexual activity: Not on file  Other Topics Concern  . Not on file  Social History Narrative   Divorced, 2 children. Smoked 1-2ppd for 20 years but stopped in 1999.    Social Determinants of Health   Financial Resource Strain:   . Difficulty of Paying Living Expenses: Not on file  Food Insecurity:   . Worried About Charity fundraiser in the Last Year: Not on  file  . Ran Out of Food in the Last Year: Not on file  Transportation Needs:   . Lack of Transportation (Medical): Not on file  . Lack of Transportation (Non-Medical): Not on file  Physical Activity:   . Days of Exercise per Week: Not on file  . Minutes of Exercise per Session: Not on file  Stress:   . Feeling of Stress : Not on file  Social Connections:   . Frequency of Communication with Friends and Family: Not on file  . Frequency of Social Gatherings with Friends and Family: Not on file  . Attends Religious Services: Not on file  . Active Member of Clubs or Organizations: Not on file  . Attends Archivist Meetings: Not on file  . Marital Status: Not on file  Intimate Partner Violence:   . Fear of Current or Ex-Partner: Not on file  . Emotionally Abused: Not on file  . Physically Abused: Not on file  . Sexually Abused: Not on file    Family History  Problem Relation Age of Onset  . Stroke Mother   . Hypertension Mother   . Hypertension Brother     Review of Systems:  As stated in the HPI and otherwise negative.   BP (!) 178/96   Pulse 84   Ht 5' 7.5" (1.715 m)   Wt 200 lb 12.8 oz (91.1 kg)   SpO2 97%   BMI 30.99 kg/m   Physical Examination:  General: Well developed, well nourished, NAD  HEENT: OP clear, mucus membranes moist  SKIN: warm, dry. No rashes. Neuro: No focal deficits  Musculoskeletal: Muscle strength 5/5 all ext  Psychiatric: Mood and affect normal  Neck: No JVD, no carotid bruits, no thyromegaly, no lymphadenopathy.  Lungs:Clear bilaterally, no wheezes, rhonci, crackles Cardiovascular: Regular rate and rhythm. No murmurs, gallops or rubs. Abdomen:Soft. Bowel sounds present. Non-tender.  Extremities: No lower extremity edema. Pulses are 2 + in the bilateral DP/PT.  EKG:  EKG is not ordered today. The ekg ordered today demonstrates   Echo September 2020:    1. Left ventricular ejection fraction, by visual estimation, is 60 to 65%.  The left ventricle has normal function. Normal left ventricular size. There is moderately increased left ventricular hypertrophy.  2. Left ventricular diastolic Doppler parameters are consistent with impaired relaxation pattern of LV diastolic filling.  3. Global right ventricle has normal systolic function.The right ventricular size is normal. No increase in right ventricular wall thickness.  4. Left atrial size was normal.  5. Right atrial size was normal.  6. The mitral valve is normal  in structure. Mild mitral valve regurgitation.  7. The tricuspid valve is normal in structure. Tricuspid valve regurgitation is trivial.  8. The aortic valve is tricuspid Aortic valve regurgitation is trivial by color flow Doppler. Mild aortic valve sclerosis without stenosis.  9. The pulmonic valve was grossly normal. Pulmonic valve regurgitation is not visualized by color flow Doppler.  Recent Labs: No results found for requested labs within last 8760 hours.   Lipid Panel Followed in primary care     Wt Readings from Last 3 Encounters:  07/14/19 200 lb 12.8 oz (91.1 kg)  03/10/19 193 lb (87.5 kg)  09/02/18 195 lb 6.4 oz (88.6 kg)     Other studies Reviewed: Additional studies/ records that were reviewed today include: . Review of the above records demonstrates:    Assessment and Plan:   1. CAD without angina: He has multi-vessel CAD and has had previous stents placed in the LAD, RCA and OM. Last stents in 2011. No chest pain. He is intolerant of statins. Continue ASA and beta blocker.    2. HYPERTENSION: BP has been elevated.Will try Norvasc 5 mg along with Toprol 50 BID. If BP still elevated in one week, increase Norvasc to 10 mg per day. Consider adding Losartan if needed.   3. HYPERLIPIDEMIA: He is statin intolerant. He remains unwilling to try Praluent or Repatha injections.      4. Vertebral artery disease: Noted on MRI of his neck at [facility] in January 2019. He asked me to schedule the  neck CTA in February 2019 here to further evaluate. 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 Mild carotid artery disease.   5. Carotid artery disease: Mild by neck CTA February 2019. Will repeat carotid artery dopplers in 2022.   30 minutes spent with face to face contact discussing his many complaints.   Current medicines are reviewed at length with the patient today.  The patient does not have concerns regarding medicines.  The following changes have been made:  no change  Labs/ tests ordered today include:   No orders of the defined types were placed in this encounter.   Disposition:   FU with me in 12 months   Signed, Verne Carrow, MD 07/14/2019 4:11 PM    [facility] [address] [place], [place], [place]  [address] Phone: [phone]; Fax: [phone]

## 2019-08-19 ENCOUNTER — Encounter: Payer: Self-pay | Admitting: Cardiovascular Disease

## 2019-08-19 ENCOUNTER — Ambulatory Visit (INDEPENDENT_AMBULATORY_CARE_PROVIDER_SITE_OTHER): Payer: MEDICARE | Admitting: Cardiovascular Disease

## 2019-08-19 ENCOUNTER — Other Ambulatory Visit: Payer: Self-pay

## 2019-08-19 VITALS — BP 166/88 | HR 69 | Ht 67.35 in | Wt 192.6 lb

## 2019-08-19 DIAGNOSIS — E7800 Pure hypercholesterolemia, unspecified: Secondary | ICD-10-CM | POA: Diagnosis not present

## 2019-08-19 DIAGNOSIS — I2510 Atherosclerotic heart disease of native coronary artery without angina pectoris: Secondary | ICD-10-CM

## 2019-08-19 DIAGNOSIS — I779 Disorder of arteries and arterioles, unspecified: Secondary | ICD-10-CM | POA: Diagnosis not present

## 2019-08-19 DIAGNOSIS — I10 Essential (primary) hypertension: Secondary | ICD-10-CM

## 2019-08-19 MED ORDER — AMLODIPINE BESYLATE 10 MG PO TABS: 10.0000 mg | ORAL_TABLET | Freq: Every day | ORAL | 3 refills | Status: DC

## 2019-08-19 NOTE — Patient Instructions (Signed)
Medication Instructions:  Your physician has recommended you make the following change in your medication:  1.) increase amlodipine to 10 mg once a day  *If you need a refill on your cardiac medications before your next appointment, please call your pharmacy*  Lab Work: none If you have labs (blood work) drawn today and your tests are completely normal, you will receive your results only by: Marland Kitchen MyChart Message (if you have MyChart) OR . A paper copy in the mail If you have any lab test that is abnormal or we need to change your treatment, we will call you to review the results.  Testing/Procedures: none  Follow-Up: At [facility], you and your health needs are our priority.  As part of our continuing mission to provide you with exceptional heart care, we have created designated Provider Care Teams.  These Care Teams include your primary Cardiologist (physician) and Advanced Practice Providers (APPs -  Physician Assistants and Nurse Practitioners) who all work together to provide you with the care you need, when you need it.  Your next appointment:   6 week(s)  The format for your next appointment:   In Person  Provider:   Verne Carrow, MD  Other Instructions

## 2019-08-19 NOTE — Progress Notes (Signed)
Chief Complaint  Patient presents with  . Follow-up    CAD   History of Present Illness: 69 yo male with history of CAD, HTN, hyperlipidemia, Crohns disease, DM, hiatal hernia, vertebral artery disease and carotid artery disease  here today for cardiac follow up. He had a drug eluting stent placed in the proximal RCA in 2009 and drug eluting stents placed in the LAD and OM in 2011. He has chronic issues with diarrhea due to his Crohns disease. Stress myoview October 2016 with no evidence of ischemia.  He has been off of a statin because of joint aches. He has had issues with shoulder joint pain and back pain and this is being followed at [facility]. He is seeing ortho and pain specialists at [facility]. Abnormal vertebral artery noted on MRI neck at [facility]. He asked me to schedule the neck CTA in February 2019 here to further evaluate. 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 Mild carotid artery disease. He is now on Remicade infusions for his Crohn's disease. Echo September 2020 with LVEF=60-65%. Moderate LVH. Mild MR. Trace AI. I saw him in the office in January 2021 and he had many complaints including headache, sinus fullness, lightheadedness, joint aches, muscle pains, GI upset. No chest pain or dyspnea, No palpitations, lower extremity edema, orthopnea, PND. He had some dizziness but no syncope. I did not think his symptoms were cardiac related.   He is here today for follow up. He has a multitude of complaints. He has ongoing dizziness, back pain, leg pain, skin problems, fingernail issues, ongoing issues with diarrhea. He has no chest pain or dyspnea. He has fatigue during the day. No palpitations, lower extremity edema, orthopnea, PND or syncope. He is receiving Remicade infusion for his Crohn's disease. He thinks this is making him feel poorly.   Primary Care Physician: Larene Beach,  MD  Past Medical History:  Diagnosis Date  . Coronary artery disease    DES placed in proximal RCA August 2009 and drug eluting stents in the LAD and the OM in February 2011  . Crohn's disease ([place])   . Diabetes mellitus, type 2 ([place])   . GERD (gastroesophageal reflux disease)   . Hiatal hernia   . Hypertension     Past Surgical History:  Procedure Laterality Date  . CARPAL TUNNEL RELEASE    . CHOLECYSTECTOMY    . laporoscopic knee surgery      Current Outpatient Medications  Medication Sig Dispense Refill  . aspirin (ASPIR-81) 81 MG EC tablet Take 81 mg by mouth daily.      . clobetasol cream (TEMOVATE) 5.00 % Apply 1 application topically 2 (two) times daily as needed. (psorasis)    . inFLIXimab (REMICADE) 100 MG injection Inject 100 mg into the vein as directed.    . metoprolol succinate (TOPROL-XL) 50 MG 24 hr tablet TAKE 1 TABLET BY MOUTH 2  TIMES DAILY. TAKE WITH OR  IMMEDIATELY FOLLOWING A  MEAL. 180 tablet 3  . ONE TOUCH ULTRA TEST test strip CHECK GLUCOSE 3 TIMES DAILY  4  . TESTOSTERONE IM Inject into the muscle.    Marland Kitchen amLODipine (NORVASC) 10 MG tablet Take 1 tablet (10 mg total) by mouth daily. 90 tablet 3   No current facility-administered medications for this visit.    Allergies  Allergen Reactions  . Chlorthalidone     REACTION: dizzy  .  Clopidogrel Bisulfate     REACTION: leg pain and fluid on knee  . Dicyclomine     REACTION: dizzy/bloated  . Doxycycline     REACTION: dizzy  . Metformin     REACTION: headache  . Niacin     REACTION: rash    Social History   Socioeconomic History  . Marital status: Divorced    Spouse name: Not on file  . Number of children: Not on file  . Years of education: Not on file  . Highest education level: Not on file  Occupational History  . Not on file  Tobacco Use  . Smoking status: Former Smoker    Quit date: 06/24/1989    Years since quitting: 30.1  . Smokeless tobacco: Current User    Types: Chew  Substance  and Sexual Activity  . Alcohol use: No  . Drug use: No  . Sexual activity: Not on file  Other Topics Concern  . Not on file  Social History Narrative   Divorced, 2 children. Smoked 1-2ppd for 20 years but stopped in 1999.    Social Determinants of Health   Financial Resource Strain:   . Difficulty of Paying Living Expenses: Not on file  Food Insecurity:   . Worried About Programme researcher, broadcasting/film/video in the Last Year: Not on file  . Ran Out of Food in the Last Year: Not on file  Transportation Needs:   . Lack of Transportation (Medical): Not on file  . Lack of Transportation (Non-Medical): Not on file  Physical Activity:   . Days of Exercise per Week: Not on file  . Minutes of Exercise per Session: Not on file  Stress:   . Feeling of Stress : Not on file  Social Connections:   . Frequency of Communication with Friends and Family: Not on file  . Frequency of Social Gatherings with Friends and Family: Not on file  . Attends Religious Services: Not on file  . Active Member of Clubs or Organizations: Not on file  . Attends Banker Meetings: Not on file  . Marital Status: Not on file  Intimate Partner Violence:   . Fear of Current or Ex-Partner: Not on file  . Emotionally Abused: Not on file  . Physically Abused: Not on file  . Sexually Abused: Not on file    Family History  Problem Relation Age of Onset  . Stroke Mother   . Hypertension Mother   . Hypertension Brother     Review of Systems:  As stated in the HPI and otherwise negative.   BP (!) 166/88   Pulse 69   Ht 5' 7.35" (1.711 m)   Wt 192 lb 9.6 oz (87.4 kg)   SpO2 97%   BMI 29.85 kg/m   Physical Examination:  General: Well developed, well nourished, NAD  HEENT: OP clear, mucus membranes moist  SKIN: warm, dry. No rashes. Neuro: No focal deficits  Musculoskeletal: Muscle strength 5/5 all ext  Psychiatric: Mood and affect normal  Neck: No JVD, no carotid bruits, no thyromegaly, no lymphadenopathy.    Lungs:Clear bilaterally, no wheezes, rhonci, crackles Cardiovascular: Regular rate and rhythm. No murmurs, gallops or rubs. Abdomen:Soft. Bowel sounds present. Non-tender.  Extremities: No lower extremity edema. Pulses are 2 + in the bilateral DP/PT.  EKG:  EKG is ordered today. The ekg ordered today demonstrates NSR, rate 69 bpm  Echo September 2020:   1. Left ventricular ejection fraction, by visual estimation, is 60 to 65%. The  left ventricle has normal function. Normal left ventricular size. There is moderately increased left ventricular hypertrophy.  2. Left ventricular diastolic Doppler parameters are consistent with impaired relaxation pattern of LV diastolic filling.  3. Global right ventricle has normal systolic function.The right ventricular size is normal. No increase in right ventricular wall thickness.  4. Left atrial size was normal.  5. Right atrial size was normal.  6. The mitral valve is normal in structure. Mild mitral valve regurgitation.  7. The tricuspid valve is normal in structure. Tricuspid valve regurgitation is trivial.  8. The aortic valve is tricuspid Aortic valve regurgitation is trivial by color flow Doppler. Mild aortic valve sclerosis without stenosis.  9. The pulmonic valve was grossly normal. Pulmonic valve regurgitation is not visualized by color flow Doppler.  Recent Labs: No results found for requested labs within last 8760 hours.   Lipid Panel Followed in primary care     Wt Readings from Last 3 Encounters:  08/19/19 192 lb 9.6 oz (87.4 kg)  07/14/19 200 lb 12.8 oz (91.1 kg)  03/10/19 193 lb (87.5 kg)     Other studies Reviewed: Additional studies/ records that were reviewed today include: . Review of the above records demonstrates:    Assessment and Plan:   1. CAD without angina: No chest pain. He has multi-vessel CAD and has had previous stents placed in the LAD, RCA and OM. Last stents in 2011. He is intolerant of statins. Will  continue ASA and beta blocker for now. While his symptoms do not sound cardiac, I do not think a stress test would be unreasonable as it is very hard to sort out his symptom complex. We have also discussed repeating his cardiac cath but he really does not have the same chest pain he had before his stents were placed.   2. HYPERTENSION: BP is elevated on Norvasc 5 mg daily. He did not increase it to 10 mg daily as I asked. I think his elevated BP is contributing to his overall fatigue and dizziness. He will follow his BP for two weeks and call back with readings. Will add an ARB if still elevated.   3. HYPERLIPIDEMIA: He is statin intolerant. He remains unwilling to try Praluent or Repatha injections.      4. Vertebral artery disease: Noted on MRI of his neck at [facility] in January 2019. He asked me to schedule the neck CTA in February 2019 here to further evaluate. 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 Mild carotid artery disease.   5. Carotid artery disease: Mild by neck CTA February 2019. Will repeat carotid artery dopplers in 2022.    Current medicines are reviewed at length with the patient today.  The patient does not have concerns regarding medicines.  The following changes have been made:  no change  Labs/ tests ordered today include:   Orders Placed This Encounter  Procedures  . EKG 12-Lead    Disposition:   FU with me in 12 months   Signed, Verne Carrow, MD 08/19/2019 3:11 PM    [facility] [address] [place], [place], [place]  [address] Phone: [phone]; Fax: [phone]

## 2019-09-29 NOTE — Progress Notes (Signed)
Chief Complaint  Patient presents with  . Follow-up    CAD   History of Present Illness: 69 yo male with history of CAD, HTN, hyperlipidemia, Crohns disease, DM, hiatal hernia, vertebral artery disease and carotid artery disease  here today for cardiac follow up. He had a drug eluting stent placed in the proximal RCA in 2009 and drug eluting stents placed in the LAD and OM in 2011. He has chronic issues with diarrhea due to his Crohns disease. Stress myoview October 2016 with no evidence of ischemia.  He has been off of a statin because of joint aches. He has had issues with shoulder joint pain and back pain and this is being followed at [facility]. He is seeing ortho and pain specialists at [facility]. Abnormal vertebral artery noted on MRI neck at [facility]. He asked me to schedule the neck CTA in February 2019 here to further evaluate. 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 Mild carotid artery disease. He is now on Remicade infusions for his Crohn's disease. Echo September 2020 with LVEF=60-65%. Moderate LVH. Mild MR. Trace AI. I saw him in the office in January 2021 and he had many complaints including headache, sinus fullness, lightheadedness, joint aches, muscle pains, GI upset. No chest pain or dyspnea, No palpitations, lower extremity edema, orthopnea, PND. He had some dizziness but no syncope. I did not think his symptoms were cardiac related. I saw him in February 2021 and he had a multitude of complaints. He c/o ongoing dizziness, back pain, leg pain, skin problems, fingernail issues, ongoing issues with diarrhea. He had no chest pain or dyspnea. Also daytime fatigue. He is receiving Remicade infusion for his Crohn's disease. He thinks this is making him feel poorly.   He is here today for follow up. The patient denies any chest pain, dyspnea, palpitations, lower extremity edema,  orthopnea, PND, dizziness, near syncope or syncope. He continues to have fatigue. BP 160-180 systolic at home.    Primary Care Physician: Alain Honey, MD  Past Medical History:  Diagnosis Date  . Coronary artery disease    DES placed in proximal RCA August 2009 and drug eluting stents in the LAD and the OM in February 2011  . Crohn's disease (HCC)   . Diabetes mellitus, type 2 (HCC)   . GERD (gastroesophageal reflux disease)   . Hiatal hernia   . Hypertension     Past Surgical History:  Procedure Laterality Date  . CARPAL TUNNEL RELEASE    . CHOLECYSTECTOMY    . laporoscopic knee surgery      Current Outpatient Medications  Medication Sig Dispense Refill  . amLODipine (NORVASC) 10 MG tablet Take 1 tablet (10 mg total) by mouth daily. 90 tablet 3  . aspirin (ASPIR-81) 81 MG EC tablet Take 81 mg by mouth daily.      . clobetasol cream (TEMOVATE) 0.05 % Apply 1 application topically 2 (two) times daily as needed. (psorasis)    . ferrous sulfate 324 MG TBEC Take 324 mg by mouth in the morning and at bedtime.    . inFLIXimab (REMICADE) 100 MG injection Inject 100 mg into the vein as directed.    . metoprolol succinate (TOPROL-XL) 50 MG 24 hr tablet TAKE 1 TABLET BY MOUTH 2  TIMES DAILY. TAKE WITH OR  IMMEDIATELY FOLLOWING A  MEAL. 180 tablet 3  . ONE TOUCH ULTRA TEST test  strip CHECK GLUCOSE 3 TIMES DAILY  4  . TESTOSTERONE IM Inject into the muscle.    . losartan (COZAAR) 25 MG tablet Take 1 tablet (25 mg total) by mouth daily. 90 tablet 3   No current facility-administered medications for this visit.    Allergies  Allergen Reactions  . Chlorthalidone     REACTION: dizzy  . Clopidogrel Bisulfate     REACTION: leg pain and fluid on knee  . Dicyclomine     REACTION: dizzy/bloated  . Doxycycline     REACTION: dizzy  . Metformin     REACTION: headache  . Niacin     REACTION: rash    Social History   Socioeconomic History  . Marital status: Divorced    Spouse  name: Not on file  . Number of children: Not on file  . Years of education: Not on file  . Highest education level: Not on file  Occupational History  . Not on file  Tobacco Use  . Smoking status: Former Smoker    Quit date: 06/24/1989    Years since quitting: 30.2  . Smokeless tobacco: Current User    Types: Chew  Substance and Sexual Activity  . Alcohol use: No  . Drug use: No  . Sexual activity: Not on file  Other Topics Concern  . Not on file  Social History Narrative   Divorced, 2 children. Smoked 1-2ppd for 20 years but stopped in 1999.    Social Determinants of Health   Financial Resource Strain:   . Difficulty of Paying Living Expenses:   Food Insecurity:   . Worried About Programme researcher, broadcasting/film/video in the Last Year:   . Barista in the Last Year:   Transportation Needs:   . Freight forwarder (Medical):   Marland Kitchen Lack of Transportation (Non-Medical):   Physical Activity:   . Days of Exercise per Week:   . Minutes of Exercise per Session:   Stress:   . Feeling of Stress :   Social Connections:   . Frequency of Communication with Friends and Family:   . Frequency of Social Gatherings with Friends and Family:   . Attends Religious Services:   . Active Member of Clubs or Organizations:   . Attends Banker Meetings:   Marland Kitchen Marital Status:   Intimate Partner Violence:   . Fear of Current or Ex-Partner:   . Emotionally Abused:   Marland Kitchen Physically Abused:   . Sexually Abused:     Family History  Problem Relation Age of Onset  . Stroke Mother   . Hypertension Mother   . Hypertension Brother     Review of Systems:  As stated in the HPI and otherwise negative.   BP (!) 150/70   Pulse 73   Ht 5' 7.35" (1.711 m)   Wt 202 lb (91.6 kg)   SpO2 97%   BMI 31.31 kg/m   Physical Examination:  General: Well developed, well nourished, NAD  HEENT: OP clear, mucus membranes moist  SKIN: warm, dry. No rashes. Neuro: No focal deficits  Musculoskeletal: Muscle  strength 5/5 all ext  Psychiatric: Mood and affect normal  Neck: No JVD, no carotid bruits, no thyromegaly, no lymphadenopathy.  Lungs:Clear bilaterally, no wheezes, rhonci, crackles Cardiovascular: Regular rate and rhythm. No murmurs, gallops or rubs. Abdomen:Soft. Bowel sounds present. Non-tender.  Extremities: No lower extremity edema. Pulses are 2 + in the bilateral DP/PT.  EKG:  EKG is not ordered today. The ekg  ordered today demonstrates   Echo September 2020:   1. Left ventricular ejection fraction, by visual estimation, is 60 to 65%. The left ventricle has normal function. Normal left ventricular size. There is moderately increased left ventricular hypertrophy.  2. Left ventricular diastolic Doppler parameters are consistent with impaired relaxation pattern of LV diastolic filling.  3. Global right ventricle has normal systolic function.The right ventricular size is normal. No increase in right ventricular wall thickness.  4. Left atrial size was normal.  5. Right atrial size was normal.  6. The mitral valve is normal in structure. Mild mitral valve regurgitation.  7. The tricuspid valve is normal in structure. Tricuspid valve regurgitation is trivial.  8. The aortic valve is tricuspid Aortic valve regurgitation is trivial by color flow Doppler. Mild aortic valve sclerosis without stenosis.  9. The pulmonic valve was grossly normal. Pulmonic valve regurgitation is not visualized by color flow Doppler.  Recent Labs: No results found for requested labs within last 8760 hours.   Lipid Panel Followed in primary care     Wt Readings from Last 3 Encounters:  09/30/19 202 lb (91.6 kg)  08/19/19 192 lb 9.6 oz (87.4 kg)  07/14/19 200 lb 12.8 oz (91.1 kg)     Other studies Reviewed: Additional studies/ records that were reviewed today include: . Review of the above records demonstrates:    Assessment and Plan:   1. CAD without angina: He has no chest pain. He has multi-vessel  CAD and has had previous stents placed in the LAD, RCA and OM. Last stents in 2011. Continue ASA and beta blocker. He is intolerant of statins.  2. HYPERTENSION: BP is elevated at home. (systolic 580-998). Norvasc increased at last visit. Will add Losartan 25 mg daily.   3. HYPERLIPIDEMIA: He is statin intolerant. He remains unwilling to try Praluent or Repatha injections.  We have this discussion at each visit.  4. Vertebral artery disease: Noted on MRI of his neck at [facility] in January 2019. He asked me to schedule the neck CTA in February 2019 here to further evaluate. 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 Mild carotid artery disease.   5. Carotid artery disease: Mild by neck CTA February 2019. Will repeat carotid artery dopplers in 2022.    Current medicines are reviewed at length with the patient today.  The patient does not have concerns regarding medicines.  The following changes have been made:  no change  Labs/ tests ordered today include:   No orders of the defined types were placed in this encounter.   Disposition:   FU with me in 12 months   Signed, Lauree Chandler, MD 09/30/2019 11:21 AM    [facility] [place], [place], [place]  [address] Phone: [phone]; Fax: [phone]

## 2019-09-30 ENCOUNTER — Ambulatory Visit (INDEPENDENT_AMBULATORY_CARE_PROVIDER_SITE_OTHER): Payer: MEDICARE | Admitting: Cardiovascular Disease

## 2019-09-30 ENCOUNTER — Other Ambulatory Visit: Payer: Self-pay

## 2019-09-30 ENCOUNTER — Encounter: Payer: Self-pay | Admitting: Cardiovascular Disease

## 2019-09-30 VITALS — BP 150/70 | HR 73 | Ht 67.35 in | Wt 202.0 lb

## 2019-09-30 DIAGNOSIS — I779 Disorder of arteries and arterioles, unspecified: Secondary | ICD-10-CM

## 2019-09-30 DIAGNOSIS — I10 Essential (primary) hypertension: Secondary | ICD-10-CM | POA: Diagnosis not present

## 2019-09-30 DIAGNOSIS — E7800 Pure hypercholesterolemia, unspecified: Secondary | ICD-10-CM

## 2019-09-30 DIAGNOSIS — I2510 Atherosclerotic heart disease of native coronary artery without angina pectoris: Secondary | ICD-10-CM

## 2019-09-30 MED ORDER — LOSARTAN POTASSIUM 25 MG PO TABS: 25.0000 mg | ORAL_TABLET | Freq: Every day | ORAL | 3 refills | Status: DC

## 2019-09-30 NOTE — Patient Instructions (Signed)
Medication Instructions:  Your physician has recommended you make the following change in your medication:  1.) start losartan 25 mg once a day   *If you need a refill on your cardiac medications before your next appointment, please call your pharmacy*   Lab Work: None  Testing/Procedures: none   Follow-Up: At [facility], you and your health needs are our priority.  As part of our continuing mission to provide you with exceptional heart care, we have created designated Provider Care Teams.  These Care Teams include your primary Cardiologist (physician) and Advanced Practice Providers (APPs -  Physician Assistants and Nurse Practitioners) who all work together to provide you with the care you need, when you need it.  We recommend signing up for the patient portal called "MyChart".  Sign up information is provided on this After Visit Summary.  MyChart is used to connect with patients for Virtual Visits (Telemedicine).  Patients are able to view lab/test results, encounter notes, upcoming appointments, etc.  Non-urgent messages can be sent to your provider as well.   To learn more about what you can do with MyChart, go to [email or web address].    Your next appointment:   6 month(s)  The format for your next appointment:   In Person  Provider:   You may see Verne Carrow, MD or one of the following Advanced Practice Providers on your designated Care Team:    Ronie Spies, PA-C  Jacolyn Reedy, PA-C    Other Instructions

## 2019-11-19 ENCOUNTER — Other Ambulatory Visit: Payer: Self-pay | Admitting: Cardiovascular Disease

## 2019-12-20 ENCOUNTER — Telehealth: Payer: Self-pay | Admitting: Cardiovascular Disease

## 2019-12-20 MED ORDER — METOPROLOL SUCCINATE ER 50 MG PO TB24: ORAL_TABLET | ORAL | 2 refills | Status: DC

## 2019-12-20 MED ORDER — AMLODIPINE BESYLATE 10 MG PO TABS: 10.0000 mg | ORAL_TABLET | Freq: Every day | ORAL | 2 refills | Status: DC

## 2019-12-20 MED ORDER — LOSARTAN POTASSIUM 25 MG PO TABS: 25.0000 mg | ORAL_TABLET | Freq: Every day | ORAL | 2 refills | Status: DC

## 2019-12-20 NOTE — Telephone Encounter (Signed)
Pt's medications were sent to pt's pharmacy as requested. Confirmation received.  

## 2019-12-20 NOTE — Telephone Encounter (Signed)
New message   Pt c/o medication issue:  1. Name of Medication: metoprolol succinate (TOPROL-XL) 50 MG 24 hr tablet  amLODipine (NORVASC) 10 MG tablet   losartan (COZAAR) 25 MG tablet  2. How are you currently taking this medication (dosage and times per day)? As written  3. Are you having a reaction (difficulty breathing--STAT)?no   4. What is your medication issue? Patient needs a new prescription for this medication please send to [facility] - [place], [place] - [address] [place], [address]

## 2020-05-26 ENCOUNTER — Ambulatory Visit (INDEPENDENT_AMBULATORY_CARE_PROVIDER_SITE_OTHER): Payer: MEDICARE | Admitting: Cardiovascular Disease

## 2020-05-26 ENCOUNTER — Other Ambulatory Visit: Payer: Self-pay

## 2020-05-26 ENCOUNTER — Encounter: Payer: Self-pay | Admitting: Cardiovascular Disease

## 2020-05-26 VITALS — BP 144/70 | HR 78 | Ht 67.35 in | Wt 200.0 lb

## 2020-05-26 DIAGNOSIS — I779 Disorder of arteries and arterioles, unspecified: Secondary | ICD-10-CM

## 2020-05-26 DIAGNOSIS — E7800 Pure hypercholesterolemia, unspecified: Secondary | ICD-10-CM | POA: Diagnosis not present

## 2020-05-26 DIAGNOSIS — I10 Essential (primary) hypertension: Secondary | ICD-10-CM

## 2020-05-26 DIAGNOSIS — I2510 Atherosclerotic heart disease of native coronary artery without angina pectoris: Secondary | ICD-10-CM | POA: Diagnosis not present

## 2020-05-26 NOTE — Progress Notes (Signed)
Chief Complaint  Patient presents with  . Follow-up    CAD   History of Present Illness: 69 yo male with history of CAD, HTN, hyperlipidemia, Crohns disease, DM, hiatal hernia, vertebral artery disease and carotid artery disease  here today for cardiac follow up. He had a drug eluting stent placed in the proximal RCA in 2009 and drug eluting stents placed in the LAD and OM in 2011. He has chronic issues with diarrhea due to his Crohns disease. Stress myoview October 2016 with no evidence of ischemia.  He has been off of a statin because of joint aches. He has had issues with shoulder joint pain and back pain and this is being followed at [facility]. He is seeing ortho and pain specialists at [facility]. Abnormal vertebral artery noted on MRI neck at [facility]. He asked me to schedule the neck CTA in February 2019 here to further evaluate. 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 Mild carotid artery disease. He is now on Remicade infusions for his Crohn's disease. Echo September 2020 with LVEF=60-65%. Moderate LVH. Mild MR. Trace AI. I saw him in the office in January 2021 and he had many complaints including headache, sinus fullness, lightheadedness, joint aches, muscle pains, GI upset. No chest pain or dyspnea, No palpitations, lower extremity edema, orthopnea, PND. He had some dizziness but no syncope. I did not think his symptoms were cardiac related. I saw him in February 2021 and he had a multitude of complaints. He c/o ongoing dizziness, back pain, leg pain, skin problems, fingernail issues, ongoing issues with diarrhea. He had no chest pain or dyspnea. Also daytime fatigue. He was receiving Remicade infusion for his Crohn's disease. I saw him in April 2021 and he was feeling overall better with continued fatigue. He was stung by a bee several months ago.   He is here today for follow up. The  patient denies any chest pain, dyspnea, palpitations, lower extremity edema, orthopnea, PND, dizziness, near syncope or syncope. He continues to have fatigue, neck pain, back pain, diffuse joint aches. No chest pain like he had in 2009 and 2011 before stents were placed.    Primary Care Physician: Alain Honey, MD  Past Medical History:  Diagnosis Date  . Coronary artery disease    DES placed in proximal RCA August 2009 and drug eluting stents in the LAD and the OM in February 2011  . Crohn's disease (HCC)   . Diabetes mellitus, type 2 (HCC)   . GERD (gastroesophageal reflux disease)   . Hiatal hernia   . Hypertension     Past Surgical History:  Procedure Laterality Date  . CARPAL TUNNEL RELEASE    . CHOLECYSTECTOMY    . laporoscopic knee surgery      Current Outpatient Medications  Medication Sig Dispense Refill  . amLODipine (NORVASC) 10 MG tablet Take 1 tablet (10 mg total) by mouth daily. 90 tablet 2  . aspirin (ASPIR-81) 81 MG EC tablet Take 81 mg by mouth daily.      . clobetasol cream (TEMOVATE) 0.05 % Apply 1 application topically 2 (two) times daily as needed. (psorasis)    . losartan (COZAAR) 25 MG tablet Take 1 tablet (25 mg total) by mouth daily. 90 tablet 2  . metoprolol succinate (TOPROL-XL) 50 MG 24 hr tablet TAKE 1 TABLET BY MOUTH 2  TIMES DAILY. TAKE WITH OR  IMMEDIATELY  FOLLOWING A  MEAL. 180 tablet 2  . ONE TOUCH ULTRA TEST test strip CHECK GLUCOSE 3 TIMES DAILY  4  . TESTOSTERONE IM Inject into the muscle.    . Vedolizumab (ENTYVIO IV) Inject into the vein.     No current facility-administered medications for this visit.    Allergies  Allergen Reactions  . Chlorthalidone     REACTION: dizzy  . Clopidogrel Bisulfate     REACTION: leg pain and fluid on knee  . Dicyclomine     REACTION: dizzy/bloated  . Doxycycline     REACTION: dizzy  . Metformin     REACTION: headache  . Niacin     REACTION: rash    Social History   Socioeconomic History    . Marital status: Divorced    Spouse name: Not on file  . Number of children: Not on file  . Years of education: Not on file  . Highest education level: Not on file  Occupational History  . Not on file  Tobacco Use  . Smoking status: Former Smoker    Quit date: 06/24/1989    Years since quitting: 30.9  . Smokeless tobacco: Current User    Types: Chew  Substance and Sexual Activity  . Alcohol use: No  . Drug use: No  . Sexual activity: Not on file  Other Topics Concern  . Not on file  Social History Narrative   Divorced, 2 children. Smoked 1-2ppd for 20 years but stopped in 1999.    Social Determinants of Health   Financial Resource Strain:   . Difficulty of Paying Living Expenses: Not on file  Food Insecurity:   . Worried About Programme researcher, broadcasting/film/video in the Last Year: Not on file  . Ran Out of Food in the Last Year: Not on file  Transportation Needs:   . Lack of Transportation (Medical): Not on file  . Lack of Transportation (Non-Medical): Not on file  Physical Activity:   . Days of Exercise per Week: Not on file  . Minutes of Exercise per Session: Not on file  Stress:   . Feeling of Stress : Not on file  Social Connections:   . Frequency of Communication with Friends and Family: Not on file  . Frequency of Social Gatherings with Friends and Family: Not on file  . Attends Religious Services: Not on file  . Active Member of Clubs or Organizations: Not on file  . Attends Banker Meetings: Not on file  . Marital Status: Not on file  Intimate Partner Violence:   . Fear of Current or Ex-Partner: Not on file  . Emotionally Abused: Not on file  . Physically Abused: Not on file  . Sexually Abused: Not on file    Family History  Problem Relation Age of Onset  . Stroke Mother   . Hypertension Mother   . Hypertension Brother     Review of Systems:  As stated in the HPI and otherwise negative.   BP (!) 144/70   Pulse 78   Ht 5' 7.35" (1.711 m)   Wt 200 lb  (90.7 kg)   SpO2 97%   BMI 31.00 kg/m   Physical Examination:  General: Well developed, well nourished, NAD  HEENT: OP clear, mucus membranes moist  SKIN: warm, dry. No rashes. Neuro: No focal deficits  Musculoskeletal: Muscle strength 5/5 all ext  Psychiatric: Mood and affect normal  Neck: No JVD, no carotid bruits, no thyromegaly, no lymphadenopathy.  Lungs:Clear bilaterally,  no wheezes, rhonci, crackles Cardiovascular: Regular rate and rhythm. No murmurs, gallops or rubs. Abdomen:Soft. Bowel sounds present. Non-tender.  Extremities: No lower extremity edema. Pulses are 2 + in the bilateral DP/PT.  EKG:  EKG is not ordered today. The ekg ordered today demonstrates   Echo September 2020:   1. Left ventricular ejection fraction, by visual estimation, is 60 to 65%. The left ventricle has normal function. Normal left ventricular size. There is moderately increased left ventricular hypertrophy.  2. Left ventricular diastolic Doppler parameters are consistent with impaired relaxation pattern of LV diastolic filling.  3. Global right ventricle has normal systolic function.The right ventricular size is normal. No increase in right ventricular wall thickness.  4. Left atrial size was normal.  5. Right atrial size was normal.  6. The mitral valve is normal in structure. Mild mitral valve regurgitation.  7. The tricuspid valve is normal in structure. Tricuspid valve regurgitation is trivial.  8. The aortic valve is tricuspid Aortic valve regurgitation is trivial by color flow Doppler. Mild aortic valve sclerosis without stenosis.  9. The pulmonic valve was grossly normal. Pulmonic valve regurgitation is not visualized by color flow Doppler.  Recent Labs: No results found for requested labs within last 8760 hours.   Lipid Panel Followed in primary care     Wt Readings from Last 3 Encounters:  05/26/20 200 lb (90.7 kg)  09/30/19 202 lb (91.6 kg)  08/19/19 192 lb 9.6 oz (87.4 kg)      Other studies Reviewed: Additional studies/ records that were reviewed today include: . Review of the above records demonstrates:    Assessment and Plan:   1. CAD without angina: No chest pain. He has multi-vessel CAD and has had previous stents placed in the LAD, RCA and OM. Last stents in 2011. No with ongoing fatigue and not feeling well. It does not seem like his cardiac symptoms that were present prior to his stent placement. He has not had an ischemic workup since 2016. Will arrange an exercise nuclear stress test. Will continue ASA and beta blocker. He is intolerant of statins.  2. HYPERTENSION: BP is well controlled at home. Losartan added at last visit.   3. HYPERLIPIDEMIA: He is statin intolerant. He remains unwilling to try Praluent or Repatha injections.  We have this discussion at each visit.  4. Vertebral artery disease: Noted on MRI of his neck at [facility] in January 2019. He asked me to schedule the neck CTA in February 2019 here to further evaluate. 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 Mild carotid artery disease.   5. Carotid artery disease: Mild by neck CTA February 2019. Will repeat carotid artery dopplers in 2022.    Current medicines are reviewed at length with the patient today.  The patient does not have concerns regarding medicines.  The following changes have been made:  no change  Labs/ tests ordered today include:   Orders Placed This Encounter  Procedures  . MYOCARDIAL PERFUSION IMAGING    Disposition:   FU with me in 12 months   Signed, Verne Carrow, MD 05/26/2020 3:02 PM    [facility] [address] [place], [place], [place]  [address] Phone: [phone]; Fax: [phone]

## 2020-05-26 NOTE — Patient Instructions (Signed)
Medication Instructions:  Your physician recommends that you continue on your current medications as directed. Please refer to the Current Medication list given to you today.  *If you need a refill on your cardiac medications before your next appointment, please call your pharmacy*   Testing/Procedures: Your physician has requested that you have an exercise stress myoview. For further information please visit [email or web address] Please follow instruction sheet, as given.  Follow-Up: At [facility], you and your health needs are our priority.  As part of our continuing mission to provide you with exceptional heart care, we have created designated Provider Care Teams.  These Care Teams include your primary Cardiologist (physician) and Advanced Practice Providers (APPs -  Physician Assistants and Nurse Practitioners) who all work together to provide you with the care you need, when you need it.  We recommend signing up for the patient portal called "MyChart".  Sign up information is provided on this After Visit Summary.  MyChart is used to connect with patients for Virtual Visits (Telemedicine).  Patients are able to view lab/test results, encounter notes, upcoming appointments, etc.  Non-urgent messages can be sent to your provider as well.   To learn more about what you can do with MyChart, go to [email or web address].    Your next appointment:   2-3 month(s)  The format for your next appointment:   In Person  Provider:   You may see Verne Carrow, MD or one of the following Advanced Practice Providers on your designated Care Team:    Ronie Spies, PA-C  Jacolyn Reedy, PA-C

## 2020-06-12 ENCOUNTER — Other Ambulatory Visit: Payer: Self-pay

## 2020-06-12 DIAGNOSIS — I2510 Atherosclerotic heart disease of native coronary artery without angina pectoris: Secondary | ICD-10-CM

## 2020-06-14 NOTE — Progress Notes (Signed)
I opened his note and made an addendum but did not see how to sign the attestation for the stress test. Do you know how I can do this? John Cuevas

## 2020-06-29 ENCOUNTER — Telehealth (HOSPITAL_COMMUNITY): Payer: Self-pay

## 2020-06-29 NOTE — Telephone Encounter (Signed)
Detailed instructions left on the patient's answering machine. Asked to call back with any questions. John Cuevas EMTP 

## 2020-06-30 ENCOUNTER — Other Ambulatory Visit (HOSPITAL_COMMUNITY)
Admission: RE | Admit: 2020-06-30 | Discharge: 2020-06-30 | Disposition: A | Discharge status: Home / Self Care | Payer: MEDICARE | Source: Ambulatory Visit | Attending: Cardiovascular Disease | Admitting: Cardiovascular Disease

## 2020-06-30 DIAGNOSIS — Z01812 Encounter for preprocedural laboratory examination: Secondary | ICD-10-CM | POA: Diagnosis present

## 2020-06-30 DIAGNOSIS — Z20822 Contact with and (suspected) exposure to covid-19: Secondary | ICD-10-CM | POA: Insufficient documentation

## 2020-06-30 LAB — SARS CORONAVIRUS 2 (TAT 6-24 HRS): SARS Coronavirus 2: NEGATIVE

## 2020-07-04 ENCOUNTER — Ambulatory Visit (HOSPITAL_COMMUNITY): Payer: MEDICARE | Attending: Cardiology

## 2020-07-04 ENCOUNTER — Other Ambulatory Visit: Payer: Self-pay

## 2020-07-04 ENCOUNTER — Encounter (HOSPITAL_COMMUNITY): Payer: MEDICARE

## 2020-07-04 DIAGNOSIS — I2510 Atherosclerotic heart disease of native coronary artery without angina pectoris: Secondary | ICD-10-CM | POA: Diagnosis present

## 2020-07-04 LAB — MYOCARDIAL PERFUSION IMAGING
Estimated workload: 10.6 METS
Exercise duration (min): 9 min
Exercise duration (sec): 15 s
LV dias vol: 90 mL (ref 62–150)
LV sys vol: 39 mL
MPHR: 149 {beats}/min
Peak HR: 129 {beats}/min
Percent HR: 86 %
Rest HR: 82 {beats}/min
SDS: 0
SRS: 0
SSS: 0
TID: 0.86

## 2020-07-04 MED ORDER — TECHNETIUM TC 99M TETROFOSMIN IV KIT
32.5000 | PACK | Freq: Once | INTRAVENOUS | Status: AC | PRN
  Administered 2020-07-04: 32.5 via INTRAVENOUS
  Filled 2020-07-04: qty 33

## 2020-07-04 MED ORDER — TECHNETIUM TC 99M TETROFOSMIN IV KIT
11.0000 | PACK | Freq: Once | INTRAVENOUS | Status: AC | PRN
  Administered 2020-07-04: 11 via INTRAVENOUS
  Filled 2020-07-04: qty 11

## 2020-07-05 ENCOUNTER — Telehealth: Payer: Self-pay | Admitting: Cardiovascular Disease

## 2020-07-05 NOTE — Telephone Encounter (Signed)
Patient returning call for stress test results. 

## 2020-07-05 NOTE — Telephone Encounter (Signed)
Patient has been informed of results. 

## 2020-07-25 ENCOUNTER — Other Ambulatory Visit: Payer: Self-pay | Admitting: Cardiovascular Disease

## 2020-08-13 ENCOUNTER — Other Ambulatory Visit: Payer: Self-pay | Admitting: Cardiovascular Disease

## 2020-08-14 NOTE — Progress Notes (Unsigned)
Cardiology Office Note    Date:  08/16/2020   ID:  [name], DOB 05-06-1951, MRN [id number]   PCP:  Alain Honey, MD   [facility]  Cardiologist:  Verne Carrow, MD  Advanced Practice Provider:  No care team member to display Electrophysiologist:  None   :[id number]}   Chief Complaint  Patient presents with  . Follow-up    History of Present Illness:  John Cuevas is a 70 y.o. 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  Patient saw Dr. Clifton James 05/26/2020 with ongoing fatigue and not feeling well.  Different from her was symptoms with stent placement. Dr. Clifton James ordered a stress test because of his symptoms and intolerance to statins.  NST 07/04/2020 was normal.  Patient comes in for f/u. Has a bad headache and BP high. Usually runs 145/78 at home but does get high at times and takes 1/2 metoprolol. Stays tired all the time, thinks it due to his Crohn's. Also had a sausage biscuit before coming here. He has a hard time managing diet with low salt and Crohn's. Works hard with chainsaw, woodworking. BP does better when he is more active.   Past Medical History:  Diagnosis Date  . Coronary artery disease    DES placed in proximal RCA August 2009 and drug eluting stents in the LAD and the OM in February 2011  . Crohn's disease (HCC)   . Diabetes mellitus, type 2 (HCC)   . GERD (gastroesophageal reflux disease)   . Hiatal hernia   . Hypertension     Past Surgical History:  Procedure Laterality Date  . CARPAL TUNNEL RELEASE    . CHOLECYSTECTOMY    . laporoscopic knee surgery      Current  Medications: Current Meds  Medication Sig  . aspirin 81 MG EC tablet Take 81 mg by mouth daily.  . clobetasol cream (TEMOVATE) 0.05 % Apply 1 application topically 2 (two) times daily as needed. (psorasis)  . metoprolol succinate (TOPROL-XL) 50 MG 24 hr tablet TAKE 1 TABLET BY MOUTH  TWICE DAILY . TAKE WITH OR  IMMEDIATELY FOLLOWING A  MEAL  . ONE TOUCH ULTRA TEST test strip CHECK GLUCOSE 3 TIMES DAILY  . TESTOSTERONE IM Inject into the muscle.  . Vedolizumab (ENTYVIO IV) Inject into the vein.  . [DISCONTINUED] amLODipine (NORVASC) 10 MG tablet TAKE 1 TABLET BY MOUTH  DAILY  . [DISCONTINUED] losartan (COZAAR) 25 MG tablet Take 1 tablet (25 mg total) by mouth daily.     Allergies:   Balsalazide, Chlorhexidine, Mesalamine, Omeprazole, Chlorthalidone, Clopidogrel bisulfate, Dicyclomine, Doxycycline, Metformin, and Niacin   Social History   Socioeconomic History  . Marital status: Divorced    Spouse name: Not on file  . Number of children: Not on file  . Years of education: Not on file  . Highest education level: Not on file  Occupational History  . Not on file  Tobacco Use  . Smoking status: Former Smoker    Quit date: 06/24/1989    Years since quitting: 31.1  . Smokeless tobacco: Current User    Types: Chew  Substance and Sexual Activity  . Alcohol use: No  .  Drug use: No  . Sexual activity: Not on file  Other Topics Concern  . Not on file  Social History Narrative   Divorced, 2 children. Smoked 1-2ppd for 20 years but stopped in 1999.    Social Determinants of Health   Financial Resource Strain: Not on file  Food Insecurity: Not on file  Transportation Needs: Not on file  Physical Activity: Not on file  Stress: Not on file  Social Connections: Not on file     Family History:  The patient's family history includes Hypertension in his brother and mother; Stroke in his mother.   ROS:   Please see the history of present illness.    ROS All other systems reviewed and are  negative.   PHYSICAL EXAM:   VS:  BP (!) 152/72   Pulse 70   Ht 5\' 7"  (1.702 m)   Wt 209 lb 9.6 oz (95.1 kg)   SpO2 95%   BMI 32.83 kg/m   Physical Exam  GEN: Well nourished, well developed, in no acute distress  Neck: no JVD, carotid bruits, or masses Cardiac:RRR; no murmurs, rubs, or gallops  Respiratory:  clear to auscultation bilaterally, normal work of breathing GI: soft, nontender, nondistended, + BS Ext: without cyanosis, clubbing, or edema, Good distal pulses bilaterally Neuro:  Alert and Oriented x 3 Psych: euthymic mood, full affect  Wt Readings from Last 3 Encounters:  08/16/20 209 lb 9.6 oz (95.1 kg)  07/04/20 200 lb (90.7 kg)  05/26/20 200 lb (90.7 kg)      Studies/Labs Reviewed:   EKG:  EKG is  ordered today.  NSR, normal EKG. Recent Labs: No results found for requested labs within last 8760 hours.   Lipid Panel    Component Value Date/Time   CHOL 174 11/25/2011 1009   TRIG 118.0 11/25/2011 1009   HDL 34.50 (L) 11/25/2011 1009   CHOLHDL 5 11/25/2011 1009   VLDL 23.6 11/25/2011 1009   LDLCALC 116 (H) 11/25/2011 1009    Additional studies/ records that were reviewed today include:  NST 07/04/2020 Study Highlights     The left ventricular ejection fraction is normal (55-65%).  Nuclear stress EF: 56%.  Horizontal ST segment depression ST segment depression was noted during stress in the II, III, aVF and V6 leads.  This is a low risk study.   Low risk study. The ECG during peak treadmill is not interpretable due to artifact, but there are horizontal ST depressions seen in recovery. However, perfusion imaging is normal. No evidence of ischemia.    Echo September 2020:   1. Left ventricular ejection fraction, by visual estimation, is 60 to 65%. The left ventricle has normal function. Normal left ventricular size. There is moderately increased left ventricular hypertrophy.  2. Left ventricular diastolic Doppler parameters are consistent with  impaired relaxation pattern of LV diastolic filling.  3. Global right ventricle has normal systolic function.The right ventricular size is normal. No increase in right ventricular wall thickness.  4. Left atrial size was normal.  5. Right atrial size was normal.  6. The mitral valve is normal in structure. Mild mitral valve regurgitation.  7. The tricuspid valve is normal in structure. Tricuspid valve regurgitation is trivial.  8. The aortic valve is tricuspid Aortic valve regurgitation is trivial by color flow Doppler. Mild aortic valve sclerosis without stenosis.  9. The pulmonic valve was grossly normal. Pulmonic valve regurgitation is not visualized by color flow Doppler.      Risk Assessment/Calculations:  ASSESSMENT:    1. Coronary artery disease involving native coronary artery of native heart without angina pectoris   2. Essential hypertension   3. Other hyperlipidemia   4. Vertebral artery disease (HCC)   5. Fatigue, unspecified type   6. Crohn's disease of colon without complication (HCC)      PLAN:  In order of problems listed above:  CAD status post DES to the proximal RCA 2009 DES to the LAD and OM 2011, NST 07/04/2020 normal-no angina, just extreme fatigue.   Hypertension BP running high but has a headache and ate a biscuit on his way here. Up and down at home. He thinks fatigue is related to amlodipine. Will try to increase valsartan 50 mg daily and decrease amlodipine 5 mg daily. F/u in 3-4 weeks  Hyperlipidemia intolerant to statins has declined PCSK9I.  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 Has been anemic in past but didn't tolerate iron supplements. Also  adjusting BP meds as above.  Shared Decision Making/Informed Consent        Medication Adjustments/Labs and Tests Ordered: Current medicines are reviewed at length with the patient today.  Concerns regarding medicines are outlined above.  Medication changes, Labs and Tests ordered today are listed in the Patient Instructions below. Patient Instructions  Medication Instructions:  Your physician has recommended you make the following change in your medication:  DECREASE: Amlodipine to 5mg  daily (1/2 tablet) INCREASE: Losartan to 50mg  daily (2 tablets)  *If you need a refill on your cardiac medications before your next appointment, please call your pharmacy*   Lab Work: TODAY: BMET, CBC, TSH  If you have labs (blood work) drawn today and your tests are completely normal, you will receive your results only by: MyChart Message (if you have MyChart) OR . A paper copy in the mail If you have any lab test that is abnormal or we need to change your treatment, we will call you to review the results.   Follow-Up: At [facility], you and your health needs are our priority.  As part of our continuing mission to provide you with exceptional heart care, we have created designated Provider Care Teams.  These Care Teams include your primary Cardiologist (physician) and Advanced Practice Providers (APPs -  Physician Assistants and Nurse Practitioners) who all work together to provide you with the care you need, when you need it.  We recommend signing up for the patient portal called "MyChart".  Sign up information is provided on this After Visit Summary.  MyChart is used to connect with patients for Virtual Visits (Telemedicine).  Patients are able to view lab/test results, encounter notes, upcoming appointments, etc.  Non-urgent messages can be sent to your provider as well.   To learn more about what you can do with MyChart, go to Marland Kitchen.    Your next appointment:     09/13/2020  The format for your next appointment:   In Person  Provider:   [email or web address], PA-C       Signed, 09/15/2020, PA-C  08/16/2020 10:58 AM    [facility] [address] [place], [place], [place]  [place] Phone: [phone]; Fax: [phone]

## 2020-08-16 ENCOUNTER — Other Ambulatory Visit: Payer: Self-pay

## 2020-08-16 ENCOUNTER — Ambulatory Visit (INDEPENDENT_AMBULATORY_CARE_PROVIDER_SITE_OTHER): Payer: MEDICARE | Admitting: Physician Assistant

## 2020-08-16 ENCOUNTER — Encounter: Payer: Self-pay | Admitting: Physician Assistant

## 2020-08-16 VITALS — BP 152/72 | HR 70 | Ht 67.0 in | Wt 209.6 lb

## 2020-08-16 DIAGNOSIS — I2510 Atherosclerotic heart disease of native coronary artery without angina pectoris: Secondary | ICD-10-CM | POA: Diagnosis not present

## 2020-08-16 DIAGNOSIS — R5383 Other fatigue: Secondary | ICD-10-CM

## 2020-08-16 DIAGNOSIS — K5010 Crohn's disease of large intestine without complications: Secondary | ICD-10-CM

## 2020-08-16 DIAGNOSIS — E7849 Other hyperlipidemia: Secondary | ICD-10-CM

## 2020-08-16 DIAGNOSIS — I779 Disorder of arteries and arterioles, unspecified: Secondary | ICD-10-CM | POA: Diagnosis not present

## 2020-08-16 DIAGNOSIS — I10 Essential (primary) hypertension: Secondary | ICD-10-CM | POA: Diagnosis not present

## 2020-08-16 LAB — CBC
Hematocrit: 45.6 % (ref 37.5–51.0)
Hemoglobin: 14.4 g/dL (ref 13.0–17.7)
MCH: 25.3 pg — ABNORMAL LOW (ref 26.6–33.0)
MCHC: 31.6 g/dL (ref 31.5–35.7)
MCV: 80 fL (ref 79–97)
Platelets: 307 10*3/uL (ref 150–450)
RBC: 5.69 x10E6/uL (ref 4.14–5.80)
RDW: 14.6 % (ref 11.6–15.4)
WBC: 6.4 10*3/uL (ref 3.4–10.8)

## 2020-08-16 LAB — BASIC METABOLIC PANEL
BUN/Creatinine Ratio: 12 (ref 10–24)
BUN: 15 mg/dL (ref 8–27)
CO2: 23 mmol/L (ref 20–29)
Calcium: 9.5 mg/dL (ref 8.6–10.2)
Chloride: 102 mmol/L (ref 96–106)
Creatinine, Ser: 1.25 mg/dL (ref 0.76–1.27)
GFR calc Af Amer: 67 mL/min/{1.73_m2} (ref >59)
GFR calc non Af Amer: 58 mL/min/{1.73_m2} — ABNORMAL LOW (ref >59)
Glucose: 241 mg/dL — ABNORMAL HIGH (ref 65–99)
Potassium: 4.3 mmol/L (ref 3.5–5.2)
Sodium: 138 mmol/L (ref 134–144)

## 2020-08-16 LAB — TSH: TSH: 2.06 u[IU]/mL (ref 0.450–4.500)

## 2020-08-16 MED ORDER — LOSARTAN POTASSIUM 25 MG PO TABS: 50.0000 mg | ORAL_TABLET | Freq: Every day | ORAL | 0 refills | Status: DC

## 2020-08-16 MED ORDER — AMLODIPINE BESYLATE 10 MG PO TABS: 5.0000 mg | ORAL_TABLET | Freq: Every day | ORAL | 3 refills | Status: DC

## 2020-08-16 NOTE — Patient Instructions (Signed)
Medication Instructions:  Your physician has recommended you make the following change in your medication:  DECREASE: Amlodipine to 5mg  daily (1/2 tablet) INCREASE: Losartan to 50mg  daily (2 tablets)  *If you need a refill on your cardiac medications before your next appointment, please call your pharmacy*   Lab Work: TODAY: BMET, CBC, TSH  If you have labs (blood work) drawn today and your tests are completely normal, you will receive your results only by: MyChart Message (if you have MyChart) OR . A paper copy in the mail If you have any lab test that is abnormal or we need to change your treatment, we will call you to review the results.   Follow-Up: At [facility], you and your health needs are our priority.  As part of our continuing mission to provide you with exceptional heart care, we have created designated Provider Care Teams.  These Care Teams include your primary Cardiologist (physician) and Advanced Practice Providers (APPs -  Physician Assistants and Nurse Practitioners) who all work together to provide you with the care you need, when you need it.  We recommend signing up for the patient portal called "MyChart".  Sign up information is provided on this After Visit Summary.  MyChart is used to connect with patients for Virtual Visits (Telemedicine).  Patients are able to view lab/test results, encounter notes, upcoming appointments, etc.  Non-urgent messages can be sent to your provider as well.   To learn more about what you can do with MyChart, go to Marland Kitchen.    Your next appointment:    09/13/2020  The format for your next appointment:   In Person  Provider:   [email or web address], PA-C

## 2020-08-18 ENCOUNTER — Other Ambulatory Visit: Payer: Self-pay | Admitting: Cardiovascular Disease

## 2020-08-18 ENCOUNTER — Telehealth: Payer: Self-pay | Admitting: Physician Assistant

## 2020-08-18 NOTE — Telephone Encounter (Signed)
Follow up:     Pt is returning April's call from yesterday, concerning his lab results.

## 2020-08-18 NOTE — Telephone Encounter (Signed)
The patient has been notified of the result and verbalized understanding.  All questions (if any) were answered. Leanord Hawking, RN 08/18/2020 8:50 AM

## 2020-09-12 NOTE — Progress Notes (Addendum)
Cardiology Office Note    Date:  09/13/2020   ID:  John Cuevas, DOB 1951/03/01, MRN [id number]   PCP:  Alain Honey, MD   [facility]  Cardiologist:  Verne Carrow, MD  Advanced Practice Provider:  No care team member to display Electrophysiologist:  None   [id number]   Chief Complaint  Patient presents with  . Follow-up    History of Present Illness:  John Cuevas is a 70 y.o. 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   Patient saw Dr. Clifton Cuevas 05/26/2020 with ongoing fatigue and not feeling well.  Different from her was symptoms with stent placement. Dr. Clifton Cuevas ordered a stress test because of his symptoms and intolerance to statins.  NST 07/04/2020 was normal.  I saw the patient 08/16/2020 at which time blood pressure was running high but had a sausage biscuit on his way to the office.  He was having a hard time managing his diet with low salt and Crohn's disease.  Patient also felt his fatigue was related to amlodipine.  I increased his losartan to 50 mg daily and decreased amlodipine to 5 mg daily.  Labs were stable except for elevated glucose of 241 and I asked him to follow-up for diabetes work-up with his PCP.  TSH was normal.    Patient comes in for f/u. Still very fatigued. BP running 145-180/68-90. He's wondering if his cuff isn't working properly. Glucose was 241. I asked him to see PCP for diabetes work up but he won't take diabetes meds because it all bothers his Chron's disease. He is taking losartan 50 mg bid plus an extra 1/2-1 tablet in afternoon when BP high.   Past Medical History:   Diagnosis Date  . Coronary artery disease    DES placed in proximal RCA August 2009 and drug eluting stents in the LAD and the OM in February 2011  . Crohn's disease (HCC)   . Diabetes mellitus, type 2 (HCC)   . GERD (gastroesophageal reflux disease)   . Hiatal hernia   . Hypertension     Past Surgical History:  Procedure Laterality Date  . CARPAL TUNNEL RELEASE    . CHOLECYSTECTOMY    . laporoscopic knee surgery      Current Medications: Current Meds  Medication Sig  . amLODipine (NORVASC) 10 MG tablet Take 0.5 tablets (5 mg total) by mouth daily.  Marland Kitchen aspirin 81 MG EC tablet Take 81 mg by mouth daily.  . clobetasol cream (TEMOVATE) 0.05 % Apply 1 application topically 2 (two) times daily as needed. (psorasis)  . losartan (COZAAR) 25 MG tablet Take 2 tablets (50 mg total) by mouth daily. (50 mg total daily)  . metoprolol succinate (TOPROL-XL) 50 MG 24 hr tablet TAKE 1 TABLET BY MOUTH  TWICE DAILY . TAKE WITH OR  IMMEDIATELY FOLLOWING A  MEAL  . ONE TOUCH ULTRA TEST test strip CHECK GLUCOSE 3 TIMES DAILY  . sodium fluoride (FLUORISHIELD) 1.1 % GEL dental gel Take by mouth.  . TESTOSTERONE IM Inject into the muscle.  . Vedolizumab (ENTYVIO IV) Inject into the vein.     Allergies:   Balsalazide, Chlorhexidine, Dalbavancin, Mesalamine, Omeprazole, Chlorthalidone,  Clopidogrel bisulfate, Dicyclomine, Doxycycline, Metformin, and Niacin   Social History   Socioeconomic History  . Marital status: Divorced    Spouse name: Not on file  . Number of children: Not on file  . Years of education: Not on file  . Highest education level: Not on file  Occupational History  . Not on file  Tobacco Use  . Smoking status: Former Smoker    Quit date: 06/24/1989    Years since quitting: 31.2  . Smokeless tobacco: Current User    Types: Chew  Substance and Sexual Activity  . Alcohol use: No  . Drug use: No  . Sexual activity: Not on file  Other Topics Concern  . Not on file  Social  History Narrative   Divorced, 2 children. Smoked 1-2ppd for 20 years but stopped in 1999.    Social Determinants of Health   Financial Resource Strain: Not on file  Food Insecurity: Not on file  Transportation Needs: Not on file  Physical Activity: Not on file  Stress: Not on file  Social Connections: Not on file     Family History:  The patient's family history includes Hypertension in his brother and mother; Stroke in his mother.   ROS:   Please see the history of present illness.    ROS All other systems reviewed and are negative.   PHYSICAL EXAM:   VS:  BP (!) 142/78   Pulse 75   Ht 5\' 7"  (1.702 m)   Wt 208 lb 3.2 oz (94.4 kg)   SpO2 96%   BMI 32.61 kg/m   Physical Exam  GEN: Well nourished, well developed, in no acute distress  Neck: no JVD, carotid bruits, or masses Cardiac:RRR; no murmurs, rubs, or gallops  Respiratory:  clear to auscultation bilaterally, normal work of breathing GI: soft, nontender, nondistended, + BS Ext: without cyanosis, clubbing, or edema, Good distal pulses bilaterally Neuro:  Alert and Oriented x 3 Psych: euthymic mood, full affect  Wt Readings from Last 3 Encounters:  09/13/20 208 lb 3.2 oz (94.4 kg)  08/16/20 209 lb 9.6 oz (95.1 kg)  07/04/20 200 lb (90.7 kg)      Studies/Labs Reviewed:   EKG:  EKG is not ordered today.   Recent Labs: 08/16/2020: BUN 15; Creatinine, Ser 1.25; Hemoglobin 14.4; Platelets 307; Potassium 4.3; Sodium 138; TSH 2.060   Lipid Panel    Component Value Date/Time   CHOL 174 11/25/2011 1009   TRIG 118.0 11/25/2011 1009   HDL 34.50 (L) 11/25/2011 1009   CHOLHDL 5 11/25/2011 1009   VLDL 23.6 11/25/2011 1009   LDLCALC 116 (H) 11/25/2011 1009    Additional studies/ records that were reviewed today include:  NST 07/04/2020 Study Highlights      The left ventricular ejection fraction is normal (55-65%).  Nuclear stress EF: 56%.  Horizontal ST segment depression ST segment depression was noted during  stress in the II, III, aVF and V6 leads.  This is a low risk study.   Low risk study. The ECG during peak treadmill is not interpretable due to artifact, but there are horizontal ST depressions seen in recovery. However, perfusion imaging is normal. No evidence of ischemia.     Echo September 2020:   1. Left ventricular ejection fraction, by visual estimation, is 60 to 65%. The left ventricle has normal function. Normal left ventricular size. There is moderately increased left ventricular hypertrophy.  2. Left ventricular diastolic Doppler parameters are consistent with impaired relaxation pattern of  LV diastolic filling.  3. Global right ventricle has normal systolic function.The right ventricular size is normal. No increase in right ventricular wall thickness.  4. Left atrial size was normal.  5. Right atrial size was normal.  6. The mitral valve is normal in structure. Mild mitral valve regurgitation.  7. The tricuspid valve is normal in structure. Tricuspid valve regurgitation is trivial.  8. The aortic valve is tricuspid Aortic valve regurgitation is trivial by color flow Doppler. Mild aortic valve sclerosis without stenosis.  9. The pulmonic valve was grossly normal. Pulmonic valve regurgitation is not visualized by color flow Doppler.        Risk Assessment/Calculations:         ASSESSMENT:    1. Coronary artery disease involving native coronary artery of native heart without angina pectoris   2. Essential hypertension   3. Hyperlipidemia, unspecified hyperlipidemia type   4. Vertebral artery disease (HCC)   5. Crohn's disease of colon without complication (HCC)   6. Elevated glucose      PLAN:  In order of problems listed above:  CAD status post DES to the proximal RCA 2009 DES to the LAD and OM 2011, NST 07/04/2020 normal-no angina, just extreme fatigue-may be from untreated DM.    Hypertension BP was running high at home but normal here.  I increased losartan 50  mg daily-he's taking it BID and sometimes an extra 25 mg and decrease amlodipine 5 mg daily. Will check BMET. 2 gm sodium diet.   Hyperlipidemia intolerant to statins has declined PCSK9I.   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   Elevated glucose with history of DM but refuses to take meds because of Chron's disease. Recommend he f/u with PCP.     Shared Decision Making/Informed Consent        Medication Adjustments/Labs and Tests Ordered: Current medicines are reviewed at length with the patient today.  Concerns regarding medicines are outlined above.  Medication changes, Labs and Tests ordered today are listed in the Patient Instructions below. Patient Instructions  Medication Instructions:  Your physician recommends that you continue on your current medications as directed. Please refer to the Current Medication list given to you today.  *If you need a refill on your cardiac medications before your next appointment, please call your pharmacy*   Lab Work: None today If you have labs (blood work) drawn today and your tests are completely normal, you will receive your results only by: Marland Kitchen. MyChart Message (if you have MyChart) OR . A paper copy in the mail If you have any lab test that is abnormal or we need to change your treatment, we will call you to review the results.   Follow-Up: At [facility], you and your health needs are our priority.  As part of our continuing mission to provide you with exceptional heart care, we have created designated Provider Care Teams.  These Care Teams include your primary Cardiologist (physician) and Advanced Practice Providers (APPs -  Physician Assistants  and Nurse Practitioners) who all work together to provide you with the care you need, when you need it.  We recommend signing up for the patient portal called "MyChart".  Sign up information is  provided on this After Visit Summary.  MyChart is used to connect with patients for Virtual Visits (Telemedicine).  Patients are able to view lab/test results, encounter notes, upcoming appointments, etc.  Non-urgent messages can be sent to your provider as well.   To learn more about what you can do with MyChart, go to [email or web address].    Your next appointment:   6 month(s)  The format for your next appointment:   In Person  Provider:   You may see Verne Carrow, MD or one of the following Advanced Practice Providers on your designated Care Team:    Ronie Spies, PA-C  Jacolyn Reedy, PA-C       Signed, Jacolyn Reedy, PA-C  09/13/2020 11:35 AM    [facility] [address] [place], [place], [place]  [address] Phone: [phone]; Fax: [phone]

## 2020-09-13 ENCOUNTER — Encounter: Payer: Self-pay | Admitting: Physician Assistant

## 2020-09-13 ENCOUNTER — Other Ambulatory Visit: Payer: Self-pay

## 2020-09-13 ENCOUNTER — Ambulatory Visit (INDEPENDENT_AMBULATORY_CARE_PROVIDER_SITE_OTHER): Payer: MEDICARE | Admitting: Physician Assistant

## 2020-09-13 VITALS — BP 142/78 | HR 75 | Ht 67.0 in | Wt 208.2 lb

## 2020-09-13 DIAGNOSIS — I779 Disorder of arteries and arterioles, unspecified: Secondary | ICD-10-CM

## 2020-09-13 DIAGNOSIS — I2510 Atherosclerotic heart disease of native coronary artery without angina pectoris: Secondary | ICD-10-CM

## 2020-09-13 DIAGNOSIS — I10 Essential (primary) hypertension: Secondary | ICD-10-CM

## 2020-09-13 DIAGNOSIS — R7309 Other abnormal glucose: Secondary | ICD-10-CM

## 2020-09-13 DIAGNOSIS — K5010 Crohn's disease of large intestine without complications: Secondary | ICD-10-CM

## 2020-09-13 DIAGNOSIS — E785 Hyperlipidemia, unspecified: Secondary | ICD-10-CM | POA: Diagnosis not present

## 2020-09-13 LAB — BASIC METABOLIC PANEL
BUN/Creatinine Ratio: 9 — ABNORMAL LOW (ref 10–24)
BUN: 14 mg/dL (ref 8–27)
CO2: 21 mmol/L (ref 20–29)
Calcium: 9.5 mg/dL (ref 8.6–10.2)
Chloride: 102 mmol/L (ref 96–106)
Creatinine, Ser: 1.48 mg/dL — ABNORMAL HIGH (ref 0.76–1.27)
Glucose: 174 mg/dL — ABNORMAL HIGH (ref 65–99)
Potassium: 4.5 mmol/L (ref 3.5–5.2)
Sodium: 138 mmol/L (ref 134–144)
eGFR: 50 mL/min/{1.73_m2} — ABNORMAL LOW (ref >59)

## 2020-09-13 NOTE — Patient Instructions (Signed)
Medication Instructions:  Your physician recommends that you continue on your current medications as directed. Please refer to the Current Medication list given to you today.  *If you need a refill on your cardiac medications before your next appointment, please call your pharmacy*   Lab Work: None today If you have labs (blood work) drawn today and your tests are completely normal, you will receive your results only by: Marland Kitchen MyChart Message (if you have MyChart) OR . A paper copy in the mail If you have any lab test that is abnormal or we need to change your treatment, we will call you to review the results.   Follow-Up: At [facility], you and your health needs are our priority.  As part of our continuing mission to provide you with exceptional heart care, we have created designated Provider Care Teams.  These Care Teams include your primary Cardiologist (physician) and Advanced Practice Providers (APPs -  Physician Assistants and Nurse Practitioners) who all work together to provide you with the care you need, when you need it.  We recommend signing up for the patient portal called "MyChart".  Sign up information is provided on this After Visit Summary.  MyChart is used to connect with patients for Virtual Visits (Telemedicine).  Patients are able to view lab/test results, encounter notes, upcoming appointments, etc.  Non-urgent messages can be sent to your provider as well.   To learn more about what you can do with MyChart, go to [email or web address].    Your next appointment:   6 month(s)  The format for your next appointment:   In Person  Provider:   You may see Verne Carrow, MD or one of the following Advanced Practice Providers on your designated Care Team:    Ronie Spies, PA-C  Jacolyn Reedy, PA-C

## 2020-09-13 NOTE — Addendum Note (Signed)
Addended by: Cleda Mccreedy on: 09/13/2020 11:36 AM   Modules accepted: Orders

## 2020-09-14 ENCOUNTER — Other Ambulatory Visit: Payer: Self-pay

## 2020-09-14 MED ORDER — AMLODIPINE BESYLATE 10 MG PO TABS: 5.0000 mg | ORAL_TABLET | Freq: Every day | ORAL | 3 refills | Status: DC

## 2020-09-15 ENCOUNTER — Telehealth: Payer: Self-pay | Admitting: Cardiovascular Disease

## 2020-09-15 NOTE — Telephone Encounter (Signed)
Patient was returning phone call 

## 2020-09-20 ENCOUNTER — Telehealth: Payer: Self-pay | Admitting: *Deleted

## 2020-09-20 MED ORDER — AMLODIPINE BESYLATE 10 MG PO TABS: 10.0000 mg | ORAL_TABLET | Freq: Every day | ORAL | 3 refills | Status: DC

## 2020-09-20 NOTE — Telephone Encounter (Signed)
Updated medication list to reflect amlodipine 10 mg daily and losartan 50 mg daily

## 2020-09-20 NOTE — Telephone Encounter (Signed)
I spoke to Mr. Beldin by phone today. I have asked him to take Losartan 50 mg daily and increase his Amlodipine to 10 mg daily. Thayer Ohm

## 2020-09-20 NOTE — Telephone Encounter (Addendum)
Left message for pt to call back. Dr. Clifton James would like to speak with the patient today re: recent labs and medications.

## 2020-09-21 ENCOUNTER — Other Ambulatory Visit: Payer: Self-pay

## 2020-09-21 MED ORDER — AMLODIPINE BESYLATE 10 MG PO TABS: 10.0000 mg | ORAL_TABLET | Freq: Every day | ORAL | 3 refills | Status: DC

## 2020-09-21 NOTE — Telephone Encounter (Signed)
Pt's medication was resent to pt's preferred pharmacy [facility] mail order pharmacy. Confirmation received.

## 2020-10-17 ENCOUNTER — Telehealth: Payer: Self-pay | Admitting: Cardiovascular Disease

## 2020-10-17 NOTE — Telephone Encounter (Signed)
John Cuevas, if he is taking Toprol 50 mg BID, we can increase this to 75 mg po BID. Thayer Ohm

## 2020-10-17 NOTE — Telephone Encounter (Signed)
Patient went to kidney and said kidney function is high and wants patient to get off of losartan

## 2020-10-17 NOTE — Telephone Encounter (Signed)
Care Everywhere notes from yesterday from renal indicate recommendation to stop losartan and increase metoprolol.  Pt wishes to let cardiology determine this.  I will route to Dr. Clifton James and to PharmD for recommendation on dose of metoprolol.  I can call pt and adv to follow these recommendations.

## 2020-10-17 NOTE — Telephone Encounter (Signed)
Left message for patient to call back  

## 2020-10-17 NOTE — Telephone Encounter (Signed)
Is patient taking metoprolol succinate BID?  It looks like this was changed at some point in 2019.

## 2020-10-18 MED ORDER — METOPROLOL SUCCINATE ER 50 MG PO TB24: 75.0000 mg | ORAL_TABLET | Freq: Two times a day (BID) | ORAL | 2 refills | Status: DC

## 2020-10-18 NOTE — Telephone Encounter (Signed)
Patient returning call. He states he can't wait around all day for a phone call. He states that it is okay to leave a detailed message with his medication changes in a voicemail if he is unable to answer.

## 2020-10-18 NOTE — Telephone Encounter (Signed)
Returned pt call. Advised to call office if issues/SE arise after med change. Losartan removed from medication list and pt aware to stop this. Patient verbalized understanding and agreeable to plan.

## 2021-03-15 ENCOUNTER — Ambulatory Visit (INDEPENDENT_AMBULATORY_CARE_PROVIDER_SITE_OTHER): Payer: MEDICARE | Admitting: Cardiovascular Disease

## 2021-03-15 ENCOUNTER — Encounter: Payer: Self-pay | Admitting: Cardiovascular Disease

## 2021-03-15 ENCOUNTER — Other Ambulatory Visit: Payer: Self-pay

## 2021-03-15 VITALS — BP 132/70 | HR 76 | Ht 67.0 in | Wt 199.8 lb

## 2021-03-15 DIAGNOSIS — I10 Essential (primary) hypertension: Secondary | ICD-10-CM

## 2021-03-15 DIAGNOSIS — I2510 Atherosclerotic heart disease of native coronary artery without angina pectoris: Secondary | ICD-10-CM

## 2021-03-15 DIAGNOSIS — E785 Hyperlipidemia, unspecified: Secondary | ICD-10-CM

## 2021-03-15 DIAGNOSIS — I779 Disorder of arteries and arterioles, unspecified: Secondary | ICD-10-CM | POA: Diagnosis not present

## 2021-03-15 NOTE — Progress Notes (Signed)
Chief Complaint  Patient presents with   Follow-up    CAD     History of Present Illness: 70 yo male with history of CAD, HTN, hyperlipidemia, Crohns disease, DM, hiatal hernia, vertebral artery disease and carotid artery disease  here today for cardiac follow up. He had a drug eluting stent placed in the proximal RCA in 2009 and drug eluting stents placed in the LAD and OM in 2011. Stress myoview January 2022 with no evidence of ischemia.  He has refused to take statins because of joint aches and has not wished to consider alternative therapies. Echo September 2020 with LVEF=60-65%. Moderate LVH. Mild MR. Trace AI. He has had issues with shoulder joint pain and back pain and is being followed at [facility]. He is seeing ortho and pain specialists at [facility]. Abnormal vertebral artery noted on MRI neck at [facility]. He asked me to schedule the neck CTA in February 2019 here to further evaluate. 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 Mild carotid artery disease. He is limited by his Crohn's disease.  He is here today for follow up. The patient denies any chest pain, dyspnea, palpitations, lower extremity edema, orthopnea, PND, dizziness, near syncope or syncope. He has fatigue. Still having joint   Primary Care Physician: Alain Honey, MD  Past Medical History:  Diagnosis Date   Coronary artery disease    DES placed in proximal RCA August 2009 and drug eluting stents in the LAD and the OM in February 2011   Crohn's disease [facility])    Diabetes mellitus, type 2 (HCC)    GERD (gastroesophageal reflux disease)    Hiatal hernia    Hypertension     Past Surgical History:  Procedure Laterality Date   CARPAL TUNNEL RELEASE     CHOLECYSTECTOMY     laporoscopic knee surgery      Current Outpatient Medications  Medication Sig Dispense Refill   amLODipine (NORVASC) 10 MG tablet Take  1 tablet (10 mg total) by mouth daily. 90 tablet 3   aspirin 81 MG EC tablet Take 81 mg by mouth daily.     clobetasol cream (TEMOVATE) 0.05 % Apply 1 application topically 2 (two) times daily as needed. (psorasis)     glimepiride (AMARYL) 1 MG tablet Take 1 tablet by mouth daily.     metoprolol succinate (TOPROL-XL) 50 MG 24 hr tablet Take 1.5 tablets (75 mg total) by mouth in the morning and at bedtime. Take with or immediately following a meal. 270 tablet 2   ONE TOUCH ULTRA TEST test strip CHECK GLUCOSE 3 TIMES DAILY  4   sodium fluoride (FLUORISHIELD) 1.1 % GEL dental gel Take by mouth.     TESTOSTERONE IM Inject into the muscle.     Vedolizumab (ENTYVIO IV) Inject into the vein.     No current facility-administered medications for this visit.    Allergies  Allergen Reactions   Balsalazide Other (See Comments)    Muscle spasms Muscle spasms Muscle spasms   Chlorhexidine Rash   Dalbavancin Other (See Comments)    Joint pain and joint stiffness- possible systemic reaction.   Mesalamine Other (See Comments)    Headaches  Headaches  Headaches  Headaches    Omeprazole Other (See Comments)    KNEE PAIN  KNEE PAIN  KNEE PAIN    Chlorthalidone     REACTION: dizzy   Clopidogrel  Bisulfate     REACTION: leg pain and fluid on knee   Dicyclomine     REACTION: dizzy/bloated   Doxycycline     REACTION: dizzy   Metformin     REACTION: headache   Niacin     REACTION: rash    Social History   Socioeconomic History   Marital status: Divorced    Spouse name: Not on file   Number of children: Not on file   Years of education: Not on file   Highest education level: Not on file  Occupational History   Not on file  Tobacco Use   Smoking status: Former   Smokeless tobacco: Current    Types: Chew  Substance and Sexual Activity   Alcohol use: No   Drug use: No   Sexual activity: Not on file  Other Topics Concern   Not on file  Social History Narrative   Divorced, 2  children. Smoked 1-2ppd for 20 years but stopped in 1999.    Social Determinants of Health   Financial Resource Strain: Not on file  Food Insecurity: Not on file  Transportation Needs: Not on file  Physical Activity: Not on file  Stress: Not on file  Social Connections: Not on file  Intimate Partner Violence: Not on file    Family History  Problem Relation Age of Onset   Stroke Mother    Hypertension Mother    Hypertension Brother     Review of Systems:  As stated in the HPI and otherwise negative.   BP 132/70   Pulse 76   Ht 5\' 7"  (1.702 m)   Wt 199 lb 12.8 oz (90.6 kg)   SpO2 97%   BMI 31.29 kg/m   Physical Examination:  General: Well developed, well nourished, NAD  HEENT: OP clear, mucus membranes moist  SKIN: warm, dry. No rashes. Neuro: No focal deficits  Musculoskeletal: Muscle strength 5/5 all ext  Psychiatric: Mood and affect normal  Neck: No JVD, no carotid bruits, no thyromegaly, no lymphadenopathy.  Lungs:Clear bilaterally, no wheezes, rhonci, crackles Cardiovascular: Regular rate and rhythm. No murmurs, gallops or rubs. Abdomen:Soft. Bowel sounds present. Non-tender.  Extremities: No lower extremity edema. Pulses are 2 + in the bilateral DP/PT.  EKG:  EKG is not ordered today. The ekg ordered today demonstrates   Echo September 2020:   1. Left ventricular ejection fraction, by visual estimation, is 60 to 65%. The left ventricle has normal function. Normal left ventricular size. There is moderately increased left ventricular hypertrophy.  2. Left ventricular diastolic Doppler parameters are consistent with impaired relaxation pattern of LV diastolic filling.  3. Global right ventricle has normal systolic function.The right ventricular size is normal. No increase in right ventricular wall thickness.  4. Left atrial size was normal.  5. Right atrial size was normal.  6. The mitral valve is normal in structure. Mild mitral valve regurgitation.  7. The  tricuspid valve is normal in structure. Tricuspid valve regurgitation is trivial.  8. The aortic valve is tricuspid Aortic valve regurgitation is trivial by color flow Doppler. Mild aortic valve sclerosis without stenosis.  9. The pulmonic valve was grossly normal. Pulmonic valve regurgitation is not visualized by color flow Doppler.  Recent Labs: 08/16/2020: Hemoglobin 14.4; Platelets 307; TSH 2.060 09/13/2020: BUN 14; Creatinine, Ser 1.48; Potassium 4.5; Sodium 138   Lipid Panel Followed in primary care     Wt Readings from Last 3 Encounters:  03/15/21 199 lb 12.8 oz (90.6 kg)  09/13/20  208 lb 3.2 oz (94.4 kg)  08/16/20 209 lb 9.6 oz (95.1 kg)     Other studies Reviewed: Additional studies/ records that were reviewed today include: . Review of the above records demonstrates:    Assessment and Plan:   1. CAD without angina: No chest pain. He has multi-vessel CAD and has had previous stents placed in the LAD, RCA and OM. Last stents in 2011. Nuclear stress test January 2022 with no ischemia. Will continue ASA and beta blocker. He is intolerant of statins.  2. HYPERTENSION: BP is well controlled. No changes  3. HYPERLIPIDEMIA: He is statin intolerant. He remains unwilling to try Praluent or Repatha injections.  We have this discussion at each visit.  4. Vertebral artery disease: Noted on MRI of his neck at [facility] in January 2019. He asked me to schedule the neck CTA in February 2019 here to further evaluate. 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 Mild carotid artery disease.   5. Carotid artery disease: Mild by neck CTA February 2019. Will repeat carotid artery dopplers now.    Current medicines are reviewed at length with the patient today.  The patient does not have concerns regarding medicines.  The following changes have been made:  no change  Labs/ tests ordered today  include:   Orders Placed This Encounter  Procedures   VAS US CAROTID     Disposition:   F/U with me in 12 months   Signed, Verne Carrow, MD 03/15/2021 10:38 AM    [facility] [address] [place], [place], [place]  [address] Phone: [phone]; Fax: [phone]

## 2021-03-15 NOTE — Patient Instructions (Signed)
Medication Instructions:  No changes *If you need a refill on your cardiac medications before your next appointment, please call your pharmacy*   Lab Work: none If you have labs (blood work) drawn today and your tests are completely normal, you will receive your results only by: . MyChart Message (if you have MyChart) OR . A paper copy in the mail If you have any lab test that is abnormal or we need to change your treatment, we will call you to review the results.   Testing/Procedures: Your physician has requested that you have a carotid duplex. This test is an ultrasound of the carotid arteries in your neck. It looks at blood flow through these arteries that supply the brain with blood. Allow one hour for this exam. There are no restrictions or special instructions.   Follow-Up: At [facility], you and your health needs are our priority.  As part of our continuing mission to provide you with exceptional heart care, we have created designated Provider Care Teams.  These Care Teams include your primary Cardiologist (physician) and Advanced Practice Providers (APPs -  Physician Assistants and Nurse Practitioners) who all work together to provide you with the care you need, when you need it.  We recommend signing up for the patient portal called "MyChart".  Sign up information is provided on this After Visit Summary.  MyChart is used to connect with patients for Virtual Visits (Telemedicine).  Patients are able to view lab/test results, encounter notes, upcoming appointments, etc.  Non-urgent messages can be sent to your provider as well.   To learn more about what you can do with MyChart, go to [email or web address].    Your next appointment:   12 month(s)  The format for your next appointment:   In Person  Provider:   You may see Christopher McAlhany, MD or one of the following Advanced Practice Providers on your designated Care Team:    Dayna Dunn, PA-C  Michele Lenze,  PA-C    Other Instructions   

## 2021-03-22 ENCOUNTER — Other Ambulatory Visit: Payer: Self-pay

## 2021-03-22 ENCOUNTER — Ambulatory Visit (HOSPITAL_COMMUNITY)
Admission: RE | Admit: 2021-03-22 | Discharge: 2021-03-22 | Disposition: A | Discharge status: Home / Self Care | Payer: MEDICARE | Source: Ambulatory Visit | Attending: Cardiology | Admitting: Cardiology

## 2021-03-22 DIAGNOSIS — I10 Essential (primary) hypertension: Secondary | ICD-10-CM | POA: Insufficient documentation

## 2021-03-22 DIAGNOSIS — E785 Hyperlipidemia, unspecified: Secondary | ICD-10-CM | POA: Diagnosis present

## 2021-03-22 DIAGNOSIS — I779 Disorder of arteries and arterioles, unspecified: Secondary | ICD-10-CM | POA: Diagnosis present

## 2021-03-22 DIAGNOSIS — I6523 Occlusion and stenosis of bilateral carotid arteries: Secondary | ICD-10-CM

## 2021-03-22 DIAGNOSIS — I2510 Atherosclerotic heart disease of native coronary artery without angina pectoris: Secondary | ICD-10-CM | POA: Diagnosis present

## 2021-05-23 ENCOUNTER — Other Ambulatory Visit: Payer: Self-pay | Admitting: Cardiovascular Disease

## 2021-08-18 ENCOUNTER — Other Ambulatory Visit: Payer: Self-pay | Admitting: Cardiovascular Disease

## 2021-09-11 ENCOUNTER — Inpatient Hospital Stay (HOSPITAL_COMMUNITY)
Admission: EM | Admit: 2021-09-11 | Discharge: 2021-09-13 | DRG: 247 | Disposition: A | Discharge status: Home / Self Care | Payer: MEDICARE | Attending: Interventional Cardiology | Admitting: Interventional Cardiology

## 2021-09-11 ENCOUNTER — Inpatient Hospital Stay (HOSPITAL_COMMUNITY)
Admission: EM | Disposition: A | Discharge status: Home / Self Care | Payer: Self-pay | Source: Home / Self Care | Attending: Interventional Cardiology

## 2021-09-11 ENCOUNTER — Other Ambulatory Visit: Payer: Self-pay

## 2021-09-11 ENCOUNTER — Encounter (HOSPITAL_COMMUNITY): Payer: Self-pay | Admitting: Emergency Medicine

## 2021-09-11 ENCOUNTER — Emergency Department (HOSPITAL_COMMUNITY): Payer: MEDICARE

## 2021-09-11 DIAGNOSIS — I2111 ST elevation (STEMI) myocardial infarction involving right coronary artery: Secondary | ICD-10-CM | POA: Diagnosis not present

## 2021-09-11 DIAGNOSIS — E785 Hyperlipidemia, unspecified: Secondary | ICD-10-CM | POA: Diagnosis present

## 2021-09-11 DIAGNOSIS — K5090 Crohn's disease, unspecified, without complications: Secondary | ICD-10-CM | POA: Diagnosis present

## 2021-09-11 DIAGNOSIS — E1169 Type 2 diabetes mellitus with other specified complication: Secondary | ICD-10-CM | POA: Diagnosis present

## 2021-09-11 DIAGNOSIS — Z823 Family history of stroke: Secondary | ICD-10-CM

## 2021-09-11 DIAGNOSIS — K219 Gastro-esophageal reflux disease without esophagitis: Secondary | ICD-10-CM | POA: Diagnosis present

## 2021-09-11 DIAGNOSIS — I1310 Hypertensive heart and chronic kidney disease without heart failure, with stage 1 through stage 4 chronic kidney disease, or unspecified chronic kidney disease: Secondary | ICD-10-CM | POA: Diagnosis present

## 2021-09-11 DIAGNOSIS — Z7982 Long term (current) use of aspirin: Secondary | ICD-10-CM

## 2021-09-11 DIAGNOSIS — Z79890 Hormone replacement therapy (postmenopausal): Secondary | ICD-10-CM

## 2021-09-11 DIAGNOSIS — I2119 ST elevation (STEMI) myocardial infarction involving other coronary artery of inferior wall: Principal | ICD-10-CM | POA: Diagnosis present

## 2021-09-11 DIAGNOSIS — T82855A Stenosis of coronary artery stent, initial encounter: Secondary | ICD-10-CM | POA: Diagnosis present

## 2021-09-11 DIAGNOSIS — E1122 Type 2 diabetes mellitus with diabetic chronic kidney disease: Secondary | ICD-10-CM | POA: Diagnosis present

## 2021-09-11 DIAGNOSIS — I255 Ischemic cardiomyopathy: Secondary | ICD-10-CM | POA: Diagnosis present

## 2021-09-11 DIAGNOSIS — Z8249 Family history of ischemic heart disease and other diseases of the circulatory system: Secondary | ICD-10-CM | POA: Diagnosis not present

## 2021-09-11 DIAGNOSIS — E118 Type 2 diabetes mellitus with unspecified complications: Secondary | ICD-10-CM | POA: Diagnosis not present

## 2021-09-11 DIAGNOSIS — Z888 Allergy status to other drugs, medicaments and biological substances status: Secondary | ICD-10-CM | POA: Diagnosis not present

## 2021-09-11 DIAGNOSIS — N183 Chronic kidney disease, stage 3 unspecified: Secondary | ICD-10-CM

## 2021-09-11 DIAGNOSIS — I10 Essential (primary) hypertension: Secondary | ICD-10-CM | POA: Diagnosis not present

## 2021-09-11 DIAGNOSIS — Z79899 Other long term (current) drug therapy: Secondary | ICD-10-CM | POA: Diagnosis not present

## 2021-09-11 DIAGNOSIS — I25110 Atherosclerotic heart disease of native coronary artery with unstable angina pectoris: Secondary | ICD-10-CM | POA: Diagnosis present

## 2021-09-11 DIAGNOSIS — Z881 Allergy status to other antibiotic agents status: Secondary | ICD-10-CM | POA: Diagnosis not present

## 2021-09-11 DIAGNOSIS — I2510 Atherosclerotic heart disease of native coronary artery without angina pectoris: Secondary | ICD-10-CM | POA: Diagnosis not present

## 2021-09-11 DIAGNOSIS — Z20822 Contact with and (suspected) exposure to covid-19: Secondary | ICD-10-CM | POA: Diagnosis present

## 2021-09-11 DIAGNOSIS — Y831 Surgical operation with implant of artificial internal device as the cause of abnormal reaction of the patient, or of later complication, without mention of misadventure at the time of the procedure: Secondary | ICD-10-CM | POA: Diagnosis present

## 2021-09-11 DIAGNOSIS — Z955 Presence of coronary angioplasty implant and graft: Secondary | ICD-10-CM

## 2021-09-11 DIAGNOSIS — I213 ST elevation (STEMI) myocardial infarction of unspecified site: Principal | ICD-10-CM

## 2021-09-11 DIAGNOSIS — N1831 Chronic kidney disease, stage 3a: Secondary | ICD-10-CM | POA: Diagnosis present

## 2021-09-11 HISTORY — PX: CORONARY/GRAFT ACUTE MI REVASCULARIZATION: CATH118305

## 2021-09-11 HISTORY — PX: LEFT HEART CATH AND CORONARY ANGIOGRAPHY: CATH118249

## 2021-09-11 LAB — COMPREHENSIVE METABOLIC PANEL
ALT: 27 U/L (ref 0–44)
AST: 21 U/L (ref 15–41)
Albumin: 3.8 g/dL (ref 3.5–5.0)
Alkaline Phosphatase: 86 U/L (ref 38–126)
Anion gap: 11 (ref 5–15)
BUN: 22 mg/dL (ref 8–23)
CO2: 18 mmol/L — ABNORMAL LOW (ref 22–32)
Calcium: 8.9 mg/dL (ref 8.9–10.3)
Chloride: 103 mmol/L (ref 98–111)
Creatinine, Ser: 1.65 mg/dL — ABNORMAL HIGH (ref 0.61–1.24)
GFR, Estimated: 44 mL/min — ABNORMAL LOW (ref >60)
Glucose, Bld: 251 mg/dL — ABNORMAL HIGH (ref 70–99)
Potassium: 3.9 mmol/L (ref 3.5–5.1)
Sodium: 132 mmol/L — ABNORMAL LOW (ref 135–145)
Total Bilirubin: 0.5 mg/dL (ref 0.3–1.2)
Total Protein: 8 g/dL (ref 6.5–8.1)

## 2021-09-11 LAB — CBC WITH DIFFERENTIAL/PLATELET
Abs Immature Granulocytes: 0.05 10*3/uL (ref 0.00–0.07)
Basophils Absolute: 0.1 10*3/uL (ref 0.0–0.1)
Basophils Relative: 1 %
Eosinophils Absolute: 0.2 10*3/uL (ref 0.0–0.5)
Eosinophils Relative: 2 %
HCT: 46.2 % (ref 39.0–52.0)
Hemoglobin: 14.3 g/dL (ref 13.0–17.0)
Immature Granulocytes: 0 %
Lymphocytes Relative: 31 %
Lymphs Abs: 3.6 10*3/uL (ref 0.7–4.0)
MCH: 24.8 pg — ABNORMAL LOW (ref 26.0–34.0)
MCHC: 31 g/dL (ref 30.0–36.0)
MCV: 80.2 fL (ref 80.0–100.0)
Monocytes Absolute: 1.2 10*3/uL — ABNORMAL HIGH (ref 0.1–1.0)
Monocytes Relative: 10 %
Neutro Abs: 6.6 10*3/uL (ref 1.7–7.7)
Neutrophils Relative %: 56 %
Platelets: 417 10*3/uL — ABNORMAL HIGH (ref 150–400)
RBC: 5.76 MIL/uL (ref 4.22–5.81)
RDW: 14.4 % (ref 11.5–15.5)
WBC: 11.7 10*3/uL — ABNORMAL HIGH (ref 4.0–10.5)
nRBC: 0 % (ref 0.0–0.2)

## 2021-09-11 LAB — LIPID PANEL
Cholesterol: 144 mg/dL (ref 0–200)
Cholesterol: 136 mg/dL (ref 0–200)
HDL: 25 mg/dL — ABNORMAL LOW (ref >40)
HDL: 24 mg/dL — ABNORMAL LOW (ref >40)
LDL Cholesterol: 90 mg/dL (ref 0–99)
LDL Cholesterol: 103 mg/dL — ABNORMAL HIGH (ref 0–99)
Total CHOL/HDL Ratio: 5.8 RATIO
Total CHOL/HDL Ratio: 5.7 RATIO
Triglycerides: 144 mg/dL (ref <150)
Triglycerides: 43 mg/dL (ref <150)
VLDL: 29 mg/dL (ref 0–40)
VLDL: 9 mg/dL (ref 0–40)

## 2021-09-11 LAB — APTT: aPTT: 27 seconds (ref 24–36)

## 2021-09-11 LAB — HEMOGLOBIN A1C
Hgb A1c MFr Bld: 8.2 % — ABNORMAL HIGH (ref 4.8–5.6)
Mean Plasma Glucose: 188.64 mg/dL

## 2021-09-11 LAB — PROTIME-INR
INR: 1 (ref 0.8–1.2)
Prothrombin Time: 13.6 seconds (ref 11.4–15.2)

## 2021-09-11 LAB — POCT ACTIVATED CLOTTING TIME
Activated Clotting Time: 209 seconds
Activated Clotting Time: 221 seconds
Activated Clotting Time: 239 seconds

## 2021-09-11 LAB — CREATININE, SERUM
Creatinine, Ser: 1.55 mg/dL — ABNORMAL HIGH (ref 0.61–1.24)
GFR, Estimated: 47 mL/min — ABNORMAL LOW (ref >60)

## 2021-09-11 LAB — TSH: TSH: 1.625 u[IU]/mL (ref 0.350–4.500)

## 2021-09-11 LAB — TROPONIN I (HIGH SENSITIVITY)
Troponin I (High Sensitivity): 5 ng/L (ref <18)
Troponin I (High Sensitivity): 18442 ng/L (ref <18)
Troponin I (High Sensitivity): >24000 ng/L (ref <18)
Troponin I (High Sensitivity): >24000 ng/L (ref <18)

## 2021-09-11 LAB — RESP PANEL BY RT-PCR (FLU A&B, COVID) ARPGX2
Influenza A by PCR: NEGATIVE
Influenza B by PCR: NEGATIVE
SARS Coronavirus 2 by RT PCR: NEGATIVE

## 2021-09-11 LAB — CBG MONITORING, ED: Glucose-Capillary: 244 mg/dL — ABNORMAL HIGH (ref 70–99)

## 2021-09-11 LAB — MRSA NEXT GEN BY PCR, NASAL: MRSA by PCR Next Gen: NOT DETECTED

## 2021-09-11 LAB — BRAIN NATRIURETIC PEPTIDE: B Natriuretic Peptide: 13.5 pg/mL (ref 0.0–100.0)

## 2021-09-11 LAB — GLUCOSE, CAPILLARY
Glucose-Capillary: 183 mg/dL — ABNORMAL HIGH (ref 70–99)
Glucose-Capillary: 164 mg/dL — ABNORMAL HIGH (ref 70–99)

## 2021-09-11 SURGERY — LEFT HEART CATH AND CORONARY ANGIOGRAPHY
Anesthesia: LOCAL

## 2021-09-11 MED ORDER — FENTANYL CITRATE PF 50 MCG/ML IJ SOSY
50.0000 ug | PREFILLED_SYRINGE | Freq: Once | INTRAMUSCULAR | Status: AC
  Administered 2021-09-11: 50 ug via INTRAVENOUS
  Filled 2021-09-11: qty 1

## 2021-09-11 MED ORDER — SODIUM CHLORIDE 0.9 % IV SOLN: INTRAVENOUS | Status: AC

## 2021-09-11 MED ORDER — OXYCODONE HCL 5 MG PO TABS: 5.0000 mg | ORAL_TABLET | ORAL | Status: DC | PRN

## 2021-09-11 MED ORDER — LIDOCAINE HCL (PF) 1 % IJ SOLN
INTRAMUSCULAR | Status: DC | PRN
  Administered 2021-09-11: 2 mL

## 2021-09-11 MED ORDER — HEPARIN SODIUM (PORCINE) 1000 UNIT/ML IJ SOLN
INTRAMUSCULAR | Status: AC
  Filled 2021-09-11: qty 10

## 2021-09-11 MED ORDER — TICAGRELOR 90 MG PO TABS
90.0000 mg | ORAL_TABLET | Freq: Two times a day (BID) | ORAL | Status: DC
  Administered 2021-09-11 – 2021-09-13 (×4): 90 mg via ORAL
  Filled 2021-09-11 (×4): qty 1

## 2021-09-11 MED ORDER — AMLODIPINE BESYLATE 10 MG PO TABS
10.0000 mg | ORAL_TABLET | Freq: Every day | ORAL | Status: DC
  Administered 2021-09-12 – 2021-09-13 (×2): 10 mg via ORAL
  Filled 2021-09-11: qty 2
  Filled 2021-09-11: qty 1
  Filled 2021-09-11: qty 2

## 2021-09-11 MED ORDER — SODIUM CHLORIDE 0.9% FLUSH
3.0000 mL | Freq: Two times a day (BID) | INTRAVENOUS | Status: DC
  Administered 2021-09-12 (×2): 3 mL via INTRAVENOUS

## 2021-09-11 MED ORDER — ATORVASTATIN CALCIUM 80 MG PO TABS
80.0000 mg | ORAL_TABLET | Freq: Every day | ORAL | Status: DC
  Administered 2021-09-12 – 2021-09-13 (×2): 80 mg via ORAL
  Filled 2021-09-11 (×3): qty 1

## 2021-09-11 MED ORDER — HEPARIN SODIUM (PORCINE) 1000 UNIT/ML IJ SOLN
INTRAMUSCULAR | Status: DC | PRN
  Administered 2021-09-11 (×2): 3000 [IU] via INTRAVENOUS
  Administered 2021-09-11: 8000 [IU] via INTRAVENOUS
  Administered 2021-09-11: 2500 [IU] via INTRAVENOUS

## 2021-09-11 MED ORDER — ACETAMINOPHEN 325 MG PO TABS: 650.0000 mg | ORAL_TABLET | ORAL | Status: DC | PRN

## 2021-09-11 MED ORDER — HEPARIN (PORCINE) IN NACL 1000-0.9 UT/500ML-% IV SOLN
INTRAVENOUS | Status: AC
  Filled 2021-09-11: qty 500

## 2021-09-11 MED ORDER — MIDAZOLAM HCL 2 MG/2ML IJ SOLN
INTRAMUSCULAR | Status: AC
  Filled 2021-09-11: qty 2

## 2021-09-11 MED ORDER — LABETALOL HCL 5 MG/ML IV SOLN: 10.0000 mg | INTRAVENOUS | Status: AC | PRN

## 2021-09-11 MED ORDER — HEPARIN SODIUM (PORCINE) 5000 UNIT/ML IJ SOLN
5000.0000 [IU] | Freq: Three times a day (TID) | INTRAMUSCULAR | Status: DC
  Administered 2021-09-11 – 2021-09-13 (×5): 5000 [IU] via SUBCUTANEOUS
  Filled 2021-09-11 (×5): qty 1

## 2021-09-11 MED ORDER — VERAPAMIL HCL 2.5 MG/ML IV SOLN
INTRAVENOUS | Status: DC | PRN
  Administered 2021-09-11: 10 mL via INTRA_ARTERIAL

## 2021-09-11 MED ORDER — TIROFIBAN HCL IN NACL 5-0.9 MG/100ML-% IV SOLN
INTRAVENOUS | Status: AC | PRN
  Administered 2021-09-11: .075 ug/kg/min via INTRAVENOUS

## 2021-09-11 MED ORDER — SODIUM CHLORIDE 0.9 % IV SOLN: INTRAVENOUS | Status: DC

## 2021-09-11 MED ORDER — HYDRALAZINE HCL 20 MG/ML IJ SOLN: 10.0000 mg | INTRAMUSCULAR | Status: AC | PRN

## 2021-09-11 MED ORDER — TIROFIBAN HCL IV 12.5 MG/250 ML
0.0750 ug/kg/min | INTRAVENOUS | Status: AC
  Administered 2021-09-11: 0.075 ug/kg/min via INTRAVENOUS
  Filled 2021-09-11 (×2): qty 250

## 2021-09-11 MED ORDER — ONDANSETRON HCL 4 MG/2ML IJ SOLN: 4.0000 mg | Freq: Four times a day (QID) | INTRAMUSCULAR | Status: DC | PRN

## 2021-09-11 MED ORDER — FENTANYL CITRATE (PF) 100 MCG/2ML IJ SOLN
INTRAMUSCULAR | Status: DC | PRN
  Administered 2021-09-11: 50 ug via INTRAVENOUS
  Administered 2021-09-11: 25 ug via INTRAVENOUS

## 2021-09-11 MED ORDER — HEPARIN SODIUM (PORCINE) 5000 UNIT/ML IJ SOLN
4000.0000 [IU] | Freq: Once | INTRAMUSCULAR | Status: AC
  Administered 2021-09-11: 4000 [IU] via INTRAVENOUS
  Filled 2021-09-11: qty 1

## 2021-09-11 MED ORDER — FENTANYL CITRATE PF 50 MCG/ML IJ SOSY
50.0000 ug | PREFILLED_SYRINGE | Freq: Once | INTRAMUSCULAR | Status: AC
  Administered 2021-09-11: 50 ug via INTRAVENOUS

## 2021-09-11 MED ORDER — TIROFIBAN (AGGRASTAT) BOLUS VIA INFUSION
INTRAVENOUS | Status: DC | PRN
  Administered 2021-09-11: 2155 ug via INTRAVENOUS

## 2021-09-11 MED ORDER — ONDANSETRON HCL 4 MG/2ML IJ SOLN
4.0000 mg | Freq: Once | INTRAMUSCULAR | Status: AC
  Administered 2021-09-11: 4 mg via INTRAVENOUS
  Filled 2021-09-11: qty 2

## 2021-09-11 MED ORDER — LIDOCAINE HCL (PF) 1 % IJ SOLN
INTRAMUSCULAR | Status: AC
  Filled 2021-09-11: qty 30

## 2021-09-11 MED ORDER — GLIMEPIRIDE 1 MG PO TABS
1.0000 mg | ORAL_TABLET | Freq: Every day | ORAL | Status: DC
  Administered 2021-09-12: 1 mg via ORAL
  Filled 2021-09-11 (×2): qty 1

## 2021-09-11 MED ORDER — SODIUM CHLORIDE 0.9 % IV SOLN: 250.0000 mL | INTRAVENOUS | Status: DC | PRN

## 2021-09-11 MED ORDER — VERAPAMIL HCL 2.5 MG/ML IV SOLN
INTRAVENOUS | Status: AC
  Filled 2021-09-11: qty 2

## 2021-09-11 MED ORDER — SODIUM CHLORIDE 0.9% FLUSH: 3.0000 mL | INTRAVENOUS | Status: DC | PRN

## 2021-09-11 MED ORDER — TICAGRELOR 90 MG PO TABS
ORAL_TABLET | ORAL | Status: AC
  Filled 2021-09-11: qty 2

## 2021-09-11 MED ORDER — TIROFIBAN HCL IN NACL 5-0.9 MG/100ML-% IV SOLN
INTRAVENOUS | Status: AC
  Filled 2021-09-11: qty 100

## 2021-09-11 MED ORDER — ASPIRIN 81 MG PO CHEW
324.0000 mg | CHEWABLE_TABLET | Freq: Once | ORAL | Status: AC
  Administered 2021-09-11: 324 mg via ORAL
  Filled 2021-09-11: qty 4

## 2021-09-11 MED ORDER — INSULIN ASPART 100 UNIT/ML IJ SOLN: 0.0000 [IU] | Freq: Three times a day (TID) | INTRAMUSCULAR | Status: DC

## 2021-09-11 MED ORDER — TICAGRELOR 90 MG PO TABS
ORAL_TABLET | ORAL | Status: DC | PRN
  Administered 2021-09-11: 180 mg via ORAL

## 2021-09-11 MED ORDER — ASPIRIN 81 MG PO CHEW
81.0000 mg | CHEWABLE_TABLET | Freq: Every day | ORAL | Status: DC
  Administered 2021-09-12 – 2021-09-13 (×2): 81 mg via ORAL
  Filled 2021-09-11 (×2): qty 1

## 2021-09-11 MED ORDER — HEPARIN (PORCINE) IN NACL 1000-0.9 UT/500ML-% IV SOLN
INTRAVENOUS | Status: DC | PRN
Start: 2021-09-11 — End: 2021-09-11
  Administered 2021-09-11 (×4): 500 mL

## 2021-09-11 MED ORDER — FENTANYL CITRATE (PF) 100 MCG/2ML IJ SOLN
INTRAMUSCULAR | Status: AC
  Filled 2021-09-11: qty 2

## 2021-09-11 MED ORDER — FENTANYL CITRATE PF 50 MCG/ML IJ SOSY
PREFILLED_SYRINGE | INTRAMUSCULAR | Status: AC
  Filled 2021-09-11: qty 1

## 2021-09-11 MED ORDER — METOPROLOL SUCCINATE ER 50 MG PO TB24
50.0000 mg | ORAL_TABLET | Freq: Every day | ORAL | Status: DC
  Administered 2021-09-12 – 2021-09-13 (×2): 50 mg via ORAL
  Filled 2021-09-11: qty 2
  Filled 2021-09-11: qty 1

## 2021-09-11 SURGICAL SUPPLY — 23 items
BALLN SAPPHIRE 2.5X15 (BALLOONS) ×2
BALLN SAPPHIRE ~~LOC~~ 3.5X15 (BALLOONS) ×1 IMPLANT
BALLOON SAPPHIRE 2.5X15 (BALLOONS) IMPLANT
CATH GUIDEZILLA II 6F (CATHETERS) IMPLANT
CATH INFINITI 5 FR JL3.5 (CATHETERS) ×1 IMPLANT
CATH INFINITI JR4 5F (CATHETERS) ×1 IMPLANT
CATH VISTA GUIDE 6FR XBRCA (CATHETERS) ×1 IMPLANT
CATHETER GUIDEZILLA II 6F (CATHETERS) ×2
DEVICE RAD COMP TR BAND LRG (VASCULAR PRODUCTS) ×1 IMPLANT
GLIDESHEATH SLEND A-KIT 6F 22G (SHEATH) ×1 IMPLANT
GUIDEWIRE INQWIRE 1.5J.035X260 (WIRE) IMPLANT
INQWIRE 1.5J .035X260CM (WIRE) ×2
KIT ENCORE 26 ADVANTAGE (KITS) ×1 IMPLANT
KIT HEART LEFT (KITS) ×2 IMPLANT
PACK CARDIAC CATHETERIZATION (CUSTOM PROCEDURE TRAY) ×2 IMPLANT
SHEATH PROBE COVER 6X72 (BAG) ×1 IMPLANT
STENT SYNERGY XD 3.0X20 (Permanent Stent) IMPLANT
STENT SYNERGY XD 3.50X8 (Permanent Stent) IMPLANT
SYNERGY XD 3.0X20 (Permanent Stent) ×2 IMPLANT
SYNERGY XD 3.50X8 (Permanent Stent) ×2 IMPLANT
TRANSDUCER W/STOPCOCK (MISCELLANEOUS) ×2 IMPLANT
TUBING CIL FLEX 10 FLL-RA (TUBING) ×2 IMPLANT
WIRE ASAHI PROWATER 180CM (WIRE) ×1 IMPLANT

## 2021-09-11 NOTE — Plan of Care (Signed)
Pt post op stemi, oriented to unit, call bell in reach ?

## 2021-09-11 NOTE — ED Provider Notes (Signed)
?[place] ?Provider Note ? ? ?CSN: [id number] ?Arrival date & time: 09/11/21  1043 ? ?  ? ?History ? ?Chief Complaint  ?Patient presents with  ? Chest Pain  ? ? ?John Cuevas is a 71 y.o. male. ? ?HPI ?71 year old male with a history of CAD and multiple prior stents presents with chest pain.  Started while he was exercising about an hour or maybe a little earlier ago.  He broke into a sweat and developed chest burning which feels similar to when he had his prior MI.  Some radiation into his arm as well as some nausea, shortness of breath.  Pain is pretty severe. ? ?Home Medications ?Prior to Admission medications   ?Medication Sig Start Date End Date Taking? Authorizing Provider  ?amLODipine (NORVASC) 10 MG tablet TAKE 1 TABLET BY MOUTH  DAILY 08/20/21   Burnell Blanks, MD  ?aspirin 81 MG EC tablet Take 81 mg by mouth daily.    [provider]  ?clobetasol cream (TEMOVATE) [id number] % Apply 1 application topically 2 (two) times daily as needed. (psorasis) 09/13/11   [provider]  ?glimepiride (AMARYL) 1 MG tablet Take 1 tablet by mouth daily. 12/08/20   [provider]  ?metoprolol succinate (TOPROL-XL) 50 MG 24 hr tablet TAKE 1 AND 1/2 TABLETS BY  MOUTH IN THE MORNING AND AT BEDTIME WITH OR IMMEDIATELY FOLLOWING A MEAL 05/24/21   Burnell Blanks, MD  ?ONE TOUCH ULTRA TEST test strip CHECK GLUCOSE 3 TIMES DAILY 01/30/15   [provider]  ?sodium fluoride (FLUORISHIELD) 1.1 % GEL dental gel Take by mouth. 08/16/20   [provider]  ?TESTOSTERONE IM Inject into the muscle.    [provider]  ?Vedolizumab (ENTYVIO IV) Inject into the vein.    [provider]  ?   ? ?Allergies    ?Balsalazide, Chlorhexidine, Dalbavancin, Mesalamine, Omeprazole, Chlorthalidone, Clopidogrel bisulfate, Dicyclomine, Doxycycline, Metformin, and Niacin   ? ?Review of Systems   ?Review of Systems  ?Constitutional:  Positive for diaphoresis.   ?Respiratory:  Positive for shortness of breath.   ?Cardiovascular:  Positive for chest pain.  ?Gastrointestinal:  Positive for nausea. Negative for abdominal pain (chronic abdominal pain that is unchanged) and vomiting.  ? ?Physical Exam ?Updated Vital Signs ?BP (!) 150/93   Pulse 79   Temp 97.9 ?F (36.6 ?C) (Oral)   Resp (!) 25   Ht 5\' 7"  (1.702 m)   Wt 86.2 kg   SpO2 100%   BMI 29.76 kg/m?  ?Physical Exam ?Vitals and nursing note reviewed.  ?Constitutional:   ?   General: He is not in acute distress. ?   Appearance: He is well-developed. He is not ill-appearing.  ?HENT:  ?   Head: Normocephalic and atraumatic.  ?Cardiovascular:  ?   Rate and Rhythm: Normal rate and regular rhythm.  ?   Pulses:     ?     Radial pulses are 2+ on the right side.  ?   Heart sounds: Normal heart sounds.  ?Pulmonary:  ?   Effort: Pulmonary effort is normal.  ?   Breath sounds: Normal breath sounds.  ?Abdominal:  ?   General: There is no distension.  ?Musculoskeletal:  ?   Right lower leg: No edema.  ?   Left lower leg: No edema.  ?Skin: ?   General: Skin is warm and dry.  ?Neurological:  ?   Mental Status: He is alert.  ? ? ?ED Results /  Procedures / Treatments   ?Labs ?(all labs ordered are listed, but only abnormal results are displayed) ?Labs Reviewed  ?CBC WITH DIFFERENTIAL/PLATELET - Abnormal; Notable for the following components:  ?    Result Value  ? WBC 11.7 (*)   ? MCH 24.8 (*)   ? Platelets 417 (*)   ? Monocytes Absolute 1.2 (*)   ? All other components within normal limits  ?COMPREHENSIVE METABOLIC PANEL - Abnormal; Notable for the following components:  ? Sodium 132 (*)   ? CO2 18 (*)   ? Glucose, Bld 251 (*)   ? Creatinine, Ser 1.65 (*)   ? GFR, Estimated 44 (*)   ? All other components within normal limits  ?LIPID PANEL - Abnormal; Notable for the following components:  ? HDL 25 (*)   ? All other components within normal limits  ?CBG MONITORING, ED - Abnormal; Notable for the following components:  ?  Glucose-Capillary 244 (*)   ? All other components within normal limits  ?RESP PANEL BY RT-PCR (FLU A&B, COVID) [id number]  ?PROTIME-INR  ?APTT  ?HEMOGLOBIN A1C  ?TROPONIN I (HIGH SENSITIVITY)  ? ? ?EKG ?EKG Interpretation ? ?Date/Time:  Tuesday September 11 2021 10:58:44 EDT ?Ventricular Rate:  74 ?PR Interval:  204 ?QRS Duration: 103 ?QT Interval:  378 ?QTC Calculation: 420 ?R Axis:   71 ?Text Interpretation: Sinus rhythm Inferoposterior infarct, acute (RCA) Probable RV involvement, suggest recording right precordial leads ** ** ACUTE MI / STEMI ** ** Confirmed by Sherwood Gambler [phone]) on 09/11/2021 11:07:28 AM ? ?Radiology ?DG Chest Port 1 View ? ?Result Date: 09/11/2021 ?CLINICAL DATA:  Chest pain EXAM: PORTABLE CHEST 1 VIEW COMPARISON:  02/05/2008 FINDINGS: The heart size and mediastinal contours are within normal limits. Aortic atherosclerosis. No focal airspace consolidation, pleural effusion, or pneumothorax. Degenerative changes of the thoracic spine and shoulders. IMPRESSION: No active disease. Electronically Signed   By: Davina Poke D.O.   On: 09/11/2021 11:25   ? ?Procedures ?Marland KitchenCritical Care ?Performed by: Sherwood Gambler, MD ?Authorized by: Sherwood Gambler, MD  ? ?Critical care provider statement:  ?  Critical care time (minutes):  32 ?  Critical care time was exclusive of:  Separately billable procedures and treating other patients ?  Critical care was necessary to treat or prevent imminent or life-threatening deterioration of the following conditions:  Cardiac failure ?  Critical care was time spent personally by me on the following activities:  Development of treatment plan with patient or surrogate, discussions with consultants, evaluation of patient's response to treatment, examination of patient, ordering and review of laboratory studies, ordering and review of radiographic studies, ordering and performing treatments and interventions, pulse oximetry, re-evaluation of patient's condition and review of  old charts  ? ? ?Medications Ordered in ED ?Medications  ?0.9 %  sodium chloride infusion ( Intravenous New Bag/Given 09/11/21 1130)  ?aspirin chewable tablet 324 mg (324 mg Oral Given 09/11/21 1116)  ?heparin injection 4,000 Units (4,000 Units Intravenous Given 09/11/21 1116)  ?fentaNYL (SUBLIMAZE) injection 50 mcg (50 mcg Intravenous Given 09/11/21 1114)  ?ondansetron (ZOFRAN) injection 4 mg (4 mg Intravenous Given 09/11/21 1115)  ?fentaNYL (SUBLIMAZE) injection 50 mcg (50 mcg Intravenous Given 09/11/21 1130)  ? ? ?ED Course/ Medical Decision Making/ A&P ?  ?                        ?Medical Decision Making ?Problems Addressed: ?ST elevation myocardial infarction (STEMI), unspecified artery ([place]): acute illness or injury  that poses a threat to life or bodily functions ? ?Amount and/or Complexity of Data Reviewed ?Labs: ordered. ?Radiology: ordered and independent interpretation performed. ?ECG/medicine tests: ordered and independent interpretation performed. ? ?Risk ?OTC drugs. ?Parenteral controlled substances. ?Decision regarding hospitalization. ? ? ?Patient's ECG is consistent with inferior/posterior STEMI.  Upon seeing this ECG I alerted a code STEMI.  Patient will be given aspirin and heparin.  While he has normal blood pressure here, I am concerned about potential right ventricle involvement and so I have advised to hold on nitro both here and with EMS.  We will give some fentanyl for pain.  Otherwise, initial lab work is unrevealing prior to his discharge besides a slight leukocytosis.  His chest x-ray was personally reviewed and interpreted and there is no obvious pneumothorax or cardiomegaly.  Discussed case with Dr. Claiborne Billings, who accepts for emergent catheterization.  [facility] will take emergently to [facility].  ? ? ? ? ? ? ? ?Final Clinical Impression(s) / ED Diagnoses ?Final diagnoses:  ?ST elevation myocardial infarction (STEMI), unspecified artery ([place])  ? ? ?Rx / DC Orders ?ED Discharge Orders   ? ?  None  ? ?  ? ? ?  ?Sherwood Gambler, MD ?09/11/21 1219 ? ?

## 2021-09-11 NOTE — Progress Notes (Signed)
PHARMACY CONSULT NOTE - Initial Consult ? ?Pharmacy Consult for Tirofiban ?Indication:  STEMI ? ?Allergies  ?Allergen Reactions  ? Balsalazide Other (See Comments)  ?  Muscle spasms ?Muscle spasms ?Muscle spasms  ? Chlorhexidine Rash  ? Dalbavancin Other (See Comments)  ?  Joint pain and joint stiffness- possible systemic reaction.  ? Mesalamine Other (See Comments)  ?  Headaches  ?Headaches  ?Headaches  ?Headaches   ? Omeprazole Other (See Comments)  ?  KNEE PAIN  ?KNEE PAIN  ?KNEE PAIN   ? Chlorthalidone   ?  REACTION: dizzy  ? Clopidogrel Bisulfate   ?  REACTION: leg pain and fluid on knee  ? Dicyclomine   ?  REACTION: dizzy/bloated  ? Doxycycline   ?  REACTION: dizzy  ? Metformin   ?  REACTION: headache  ? Niacin   ?  REACTION: rash  ? ? ?Patient Measurements: ?Height: 5\' 7"  (170.2 cm) ?Weight: 86.2 kg (190 lb) ?IBW/kg (Calculated) : 66.1 ? ?Vital Signs: ?Temp: 97.9 ?F (36.6 ?C) (03/21 1122) ?Temp Source: Oral (03/21 1122) ?BP: 132/89 (03/21 1320) ?Pulse Rate: 0 (03/21 1340) ? ?Labs: ?Recent Labs  ?  09/11/21 ?1107  ?HGB 14.3  ?HCT 46.2  ?PLT 417*  ?APTT 27  ?LABPROT 13.6  ?INR 1.0  ?CREATININE 1.65*  ?TROPONINIHS 5  ? ? ?Estimated Creatinine Clearance: 42.4 mL/min (A) (by C-G formula based on SCr of 1.65 mg/dL (H)). ? ? ?Medical History: ?Past Medical History:  ?Diagnosis Date  ? Coronary artery disease   ? DES placed in proximal RCA August 2009 and drug eluting stents in the LAD and the OM in February 2011  ? Crohn's disease ([place])   ? Diabetes mellitus, type 2 ([place])   ? GERD (gastroesophageal reflux disease)   ? Hiatal hernia   ? Hypertension   ? ? ?Medications:  ?Awaiting home med rec ? ?Assessment: ?71 y.o. M presents with STEMI. S/p cath which showed subtotal occlusion of distal RCA which was stented. Also patent stens in prox RCA and prox LAD. Tirofiban started in cath lab and to be continued for 18 hours post- cath. ? ?Plan:  ?Tirofiban 0.064mcg/kg/min x 18 hours ?Will f/u plt in a.m. ? ?Sherlon Handing,  PharmD, BCPS ?Please see amion for complete clinical pharmacist phone list ?09/11/2021,3:04 PM ? ? ?

## 2021-09-11 NOTE — ED Notes (Signed)
Patient refuses to take out dentures at this time. ? ?

## 2021-09-11 NOTE — H&P (Addendum)
?Cardiology Admission History and Physical:  ? ?Patient ID: John Cuevas ?MRN: [id number]; DOB: 01/28/1951  ? ?Admission date: 09/11/2021 ? ?PCP:  Larene Beach, MD ?  ?[facility] Providers ?Cardiologist:  Lauree Chandler, MD      ? ? ?Chief Complaint: Inferior STEMI ? ?Patient Profile:  ? ?John Cuevas is a 71 y.o. male with history of CAD with RCA stent 2009 and circumflex and LAD stents 2011 who is being seen 09/11/2021 for the evaluation of diaphoresis, dyspnea, and left arm pain with EKG [facility] demonstrating inferior STEMI.. ? ?History of Present Illness:  ? ?John Cuevas he was exercising this morning around 830 or 9:00 AM and began having diaphoresis, nausea, and ultimately chest discomfort with radiation into the left arm.  He went to the emergency room where he was found to have ST elevation in the inferior leads.  Symptoms of been ongoing since that time. ? ?History of statin intolerance.  Had difficulty with "blood thinners in the past".  Multiple other complaints related to arthritis and Crohn's disease. ? ? ?Past Medical History:  ?Diagnosis Date  ? Coronary artery disease   ? DES placed in proximal RCA August 2009 and drug eluting stents in the LAD and the OM in February 2011  ? Crohn's disease ([place])   ? Diabetes mellitus, type 2 ([place])   ? GERD (gastroesophageal reflux disease)   ? Hiatal hernia   ? Hypertension   ? ? ?Past Surgical History:  ?Procedure Laterality Date  ? CARPAL TUNNEL RELEASE    ? CHOLECYSTECTOMY    ? laporoscopic knee surgery    ?  ? ?Medications Prior to Admission: ?Prior to Admission medications   ?Medication Sig Start Date End Date Taking? Authorizing Provider  ?amLODipine (NORVASC) 10 MG tablet TAKE 1 TABLET BY MOUTH  DAILY 08/20/21   Burnell Blanks, MD  ?aspirin 81 MG EC tablet Take 81 mg by mouth daily.    [provider]  ?clobetasol cream (TEMOVATE) [id number] % Apply 1 application topically 2 (two) times daily as needed.  (psorasis) 09/13/11   [provider]  ?glimepiride (AMARYL) 1 MG tablet Take 1 tablet by mouth daily. 12/08/20   [provider]  ?metoprolol succinate (TOPROL-XL) 50 MG 24 hr tablet TAKE 1 AND 1/2 TABLETS BY  MOUTH IN THE MORNING AND AT BEDTIME WITH OR IMMEDIATELY FOLLOWING A MEAL 05/24/21   Burnell Blanks, MD  ?ONE TOUCH ULTRA TEST test strip CHECK GLUCOSE 3 TIMES DAILY 01/30/15   [provider]  ?sodium fluoride (FLUORISHIELD) 1.1 % GEL dental gel Take by mouth. 08/16/20   [provider]  ?TESTOSTERONE IM Inject into the muscle.    [provider]  ?Vedolizumab (ENTYVIO IV) Inject into the vein.    [provider]  ?  ? ?Allergies:    ?Allergies  ?Allergen Reactions  ? Balsalazide Other (See Comments)  ?  Muscle spasms ?Muscle spasms ?Muscle spasms  ? Chlorhexidine Rash  ? Dalbavancin Other (See Comments)  ?  Joint pain and joint stiffness- possible systemic reaction.  ? Mesalamine Other (See Comments)  ?  Headaches  ?Headaches  ?Headaches  ?Headaches   ? Omeprazole Other (See Comments)  ?  KNEE PAIN  ?KNEE PAIN  ?KNEE PAIN   ? Chlorthalidone   ?  REACTION: dizzy  ? Clopidogrel Bisulfate   ?  REACTION: leg pain and fluid on knee  ? Dicyclomine   ?  REACTION: dizzy/bloated  ? Doxycycline   ?  REACTION: dizzy  ? Metformin   ?  REACTION: headache  ? Niacin   ?  REACTION: rash  ? ? ?Social History:   ?Social History  ? ?Socioeconomic History  ? Marital status: Divorced  ?  Spouse name: Not on file  ? Number of children: Not on file  ? Years of education: Not on file  ? Highest education level: Not on file  ?Occupational History  ? Not on file  ?Tobacco Use  ? Smoking status: Former  ? Smokeless tobacco: Current  ?  Types: Chew  ?Vaping Use  ? Vaping Use: Never used  ?Substance and Sexual Activity  ? Alcohol use: No  ? Drug use: No  ? Sexual activity: Not on file  ?Other Topics Concern  ? Not on file  ?Social History Narrative  ? Divorced, 2 children. Smoked  1-2ppd for 20 years but stopped in 1999.   ? ?Social Determinants of Health  ? ?Financial Resource Strain: Not on file  ?Food Insecurity: Not on file  ?Transportation Needs: Not on file  ?Physical Activity: Not on file  ?Stress: Not on file  ?Social Connections: Not on file  ?Intimate Partner Violence: Not on file  ?  ?Family History:   ?The patient's family history includes Hypertension in his brother and mother; Stroke in his mother.   ? ?ROS:  ?Please see the history of present illness.  ?History of Crohn's disease.  Goes to the bathroom 20-30 times a day.  All other ROS reviewed and negative.    ? ?Physical Exam/Data:  ? ?Vitals:  ? 09/11/21 1100 09/11/21 1109 09/11/21 1122 09/11/21 1214  ?BP: (!) 156/78 (!) 150/93    ?Pulse: 73 73 79   ?Resp: (!) 23 20 (!) 25   ?Temp:   97.9 ?F (36.6 ?C)   ?TempSrc:   Oral   ?SpO2: 100% 100% 100% 96%  ?Weight:      ?Height:      ? ?No intake or output data in the 24 hours ending 09/11/21 1337 ?Last 3 Weights 09/11/2021 03/15/2021 09/13/2020  ?Weight (lbs) 190 lb 199 lb 12.8 oz 208 lb 3.2 oz  ?Weight (kg) 86.183 kg 90.629 kg 94.439 kg  ?   ?Body mass index is 29.76 kg/m?.  ?General:  Well nourished, well developed, in no acute respiratory distress but significant discomfort based upon history ?HEENT: normal ?Neck: no JVD ?Vascular: No carotid bruits; Distal pulses 2+ bilaterally   ?Cardiac:  normal S1, S2; RRR; no murmur  ?Lungs:  clear to auscultation bilaterally, no wheezing, rhonchi or rales  ?Abd: soft, nontender, no hepatomegaly  ?Ext: no edema ?Musculoskeletal:  No deformities, BUE and BLE strength normal and equal ?Skin: warm and dry  ?Neuro:  CNs 2-12 intact, no focal abnormalities noted ?Psych:  Normal affect  ? ? ?EKG:  The ECG that was done [facility]/11/2021 at [facility] was personally reviewed and demonstrates revealed ST elevation 2, 3, aVF, with reciprocal ST depression lead V1 and V2. ? ?Relevant CV Studies: ?2011 and 2009 angiograms were reviewed. ? ?Laboratory  Data: ? ?High Sensitivity Troponin:   ?Recent Labs  ?Lab 09/11/21 ?1107  ?TROPONINIHS 5  ?    ?Chemistry ?Recent Labs  ?Lab 09/11/21 ?1107  ?NA 132*  ?K 3.9  ?CL 103  ?CO2 18*  ?GLUCOSE 251*  ?BUN 22  ?CREATININE 1.65*  ?CALCIUM 8.9  ?GFRNONAA 44*  ?ANIONGAP 11  ?  ?Recent Labs  ?Lab 09/11/21 ?1107  ?PROT 8.0  ?ALBUMIN 3.8  ?  AST 21  ?ALT 27  ?ALKPHOS 86  ?BILITOT 0.5  ? ?Lipids  ?Recent Labs  ?Lab 09/11/21 ?1107  ?CHOL 144  ?TRIG 144  ?HDL 25*  ?[place] 90  ?CHOLHDL 5.8  ? ?Hematology ?Recent Labs  ?Lab 09/11/21 ?1107  ?WBC 11.7*  ?RBC 5.76  ?HGB 14.3  ?HCT 46.2  ?MCV 80.2  ?MCH 24.8*  ?MCHC 31.0  ?RDW 14.4  ?PLT 417*  ? ?Thyroid No results for input(s): TSH, FREET4 in the last 168 hours. ?BNPNo results for input(s): BNP, PROBNP in the last 168 hours.  ?DDimer No results for input(s): DDIMER in the last 168 hours. ? ? ?Radiology/Studies:  ?DG Chest Port 1 View ? ?Result Date: 09/11/2021 ?CLINICAL DATA:  Chest pain EXAM: PORTABLE CHEST 1 VIEW COMPARISON:  02/05/2008 FINDINGS: The heart size and mediastinal contours are within normal limits. Aortic atherosclerosis. No focal airspace consolidation, pleural effusion, or pneumothorax. Degenerative changes of the thoracic spine and shoulders. IMPRESSION: No active disease. Electronically Signed   By: Davina Poke D.O.   On: 09/11/2021 11:25   ? ? ?Assessment and Plan:  ? ?Acute inferior ST elevation MI with duration since onset approximately 3 hours.  Diagnostic EKG and excepted in transfer from [place] emergency coronary angiography with mechanical reperfusion if technically possible.  He is hemodynamically stable but having ongoing significant chest and arm discomfort upon arrival. ?Coronary artery disease with prior proximal RCA stent 2009, proximal LAD stent 2011, and distal circumflex 2011. ?Diabetes mellitus type 2: Hemoglobin A1c is pending. ?Primary hypertension: Initial pressures elevated. ? ?Coronary angiography via the right radial  approach was described to the patient.  He has had previous angiography and stenting in the past so he is familiar.  Prior procedures were done from the right femoral approach.  He was given 4000 units of hepar

## 2021-09-11 NOTE — ED Triage Notes (Signed)
Pt to the ED with chest pain while exercising this morning that has not eased since stopping exercise. ? ?Pt describes the pain as a central burning that radiates into the left side of his neck. ? ?Pt has a history of cardiac stent placement after major blockages were discovered. ? ?

## 2021-09-12 ENCOUNTER — Inpatient Hospital Stay (HOSPITAL_COMMUNITY): Payer: MEDICARE

## 2021-09-12 ENCOUNTER — Other Ambulatory Visit (HOSPITAL_COMMUNITY): Payer: Self-pay

## 2021-09-12 DIAGNOSIS — Z955 Presence of coronary angioplasty implant and graft: Secondary | ICD-10-CM

## 2021-09-12 DIAGNOSIS — N1831 Chronic kidney disease, stage 3a: Secondary | ICD-10-CM

## 2021-09-12 DIAGNOSIS — I25110 Atherosclerotic heart disease of native coronary artery with unstable angina pectoris: Secondary | ICD-10-CM

## 2021-09-12 DIAGNOSIS — N183 Chronic kidney disease, stage 3 unspecified: Secondary | ICD-10-CM

## 2021-09-12 DIAGNOSIS — I2119 ST elevation (STEMI) myocardial infarction involving other coronary artery of inferior wall: Secondary | ICD-10-CM

## 2021-09-12 DIAGNOSIS — I255 Ischemic cardiomyopathy: Secondary | ICD-10-CM

## 2021-09-12 LAB — BASIC METABOLIC PANEL
Anion gap: 9 (ref 5–15)
BUN: 16 mg/dL (ref 8–23)
CO2: 17 mmol/L — ABNORMAL LOW (ref 22–32)
Calcium: 8.1 mg/dL — ABNORMAL LOW (ref 8.9–10.3)
Chloride: 105 mmol/L (ref 98–111)
Creatinine, Ser: 1.44 mg/dL — ABNORMAL HIGH (ref 0.61–1.24)
GFR, Estimated: 52 mL/min — ABNORMAL LOW (ref >60)
Glucose, Bld: 196 mg/dL — ABNORMAL HIGH (ref 70–99)
Potassium: 3.7 mmol/L (ref 3.5–5.1)
Sodium: 131 mmol/L — ABNORMAL LOW (ref 135–145)

## 2021-09-12 LAB — GLUCOSE, CAPILLARY
Glucose-Capillary: 219 mg/dL — ABNORMAL HIGH (ref 70–99)
Glucose-Capillary: 157 mg/dL — ABNORMAL HIGH (ref 70–99)
Glucose-Capillary: 168 mg/dL — ABNORMAL HIGH (ref 70–99)
Glucose-Capillary: 182 mg/dL — ABNORMAL HIGH (ref 70–99)

## 2021-09-12 LAB — ECHOCARDIOGRAM COMPLETE
Area-P 1/2: 1.55 cm2
Calc EF: 57 %
Height: 67 in
S' Lateral: 3.5 cm
Single Plane A2C EF: 48.7 %
Single Plane A4C EF: 60.3 %
Weight: 3040 oz

## 2021-09-12 LAB — CBC
HCT: 37.4 % — ABNORMAL LOW (ref 39.0–52.0)
Hemoglobin: 12 g/dL — ABNORMAL LOW (ref 13.0–17.0)
MCH: 25.4 pg — ABNORMAL LOW (ref 26.0–34.0)
MCHC: 32.1 g/dL (ref 30.0–36.0)
MCV: 79.1 fL — ABNORMAL LOW (ref 80.0–100.0)
Platelets: 385 10*3/uL (ref 150–400)
RBC: 4.73 MIL/uL (ref 4.22–5.81)
RDW: 14.3 % (ref 11.5–15.5)
WBC: 6.9 10*3/uL (ref 4.0–10.5)
nRBC: 0 % (ref 0.0–0.2)

## 2021-09-12 LAB — LIPOPROTEIN A (LPA): Lipoprotein (a): <8.4 nmol/L (ref <75.0)

## 2021-09-12 MED ORDER — INSULIN ASPART 100 UNIT/ML IJ SOLN
0.0000 [IU] | Freq: Three times a day (TID) | INTRAMUSCULAR | Status: DC
  Administered 2021-09-12 – 2021-09-13 (×3): 3 [IU] via SUBCUTANEOUS

## 2021-09-12 MED FILL — Midazolam HCl Inj 2 MG/2ML (Base Equivalent): INTRAMUSCULAR | Qty: 2 | Status: AC

## 2021-09-12 NOTE — Care Management (Signed)
?  Transition of Care (TOC) Screening Note ? ? ?Patient Details  ?Name: ISAAH FURRY ?Date of Birth: [birth year] ? ? ?Transition of Care (TOC) CM/SW Contact:    ?Lawerance Sabal, RN ?Phone Number: ?09/12/2021, 1:16 PM ? ? ? ?Transition of Care Department [facility]) has reviewed patient and we will continue to monitor patient advancement through interdisciplinary progression rounds.  ? ?TOC following for medication assistance at DC. ?TOC pharmacy added to profile.   ?

## 2021-09-12 NOTE — Assessment & Plan Note (Signed)
EF by echo estimated to be 40-45% with inferior hypokinesis.  Echo has not yet been done.  We will make sure that it is ordered. ? ?Would likely use ARB, but need to ensure his renal function is stable.  Currently on beta-blocker and amlodipine.  Pending renal function, would either titrate beta-blocker further or add ARB. ? ?Seems euvolemic on exam. ? ?Follow-up echo. ?

## 2021-09-12 NOTE — Assessment & Plan Note (Signed)
Inferior STEMI with subtotal occluded RCA.  Thrombotic lesion treated with initial stent and then overlapping stent for what appeared to probably be stented thrombus.  It was covered with a second stent but there is still some residual thrombus in place and therefore was treated with Aggrastat. ? ?EF seem to be somewhat decreased with inferior hypokinesis.  Would hope that some of this would recover since there was restored flow. ?Echo pending. ?

## 2021-09-12 NOTE — Progress Notes (Addendum)
Subjective/Objective ? ? ?Progress Note ? ?Patient Name: John Cuevas ?Date of Encounter: 09/12/2021 ? ?[facility] Cardiologist: Lauree Chandler, MD  ? ?Patient Profile  ?   ?71 y.o. male male with history of CAD with RCA stent 2009 and circumflex and LAD stents 2011 who is being seen 09/11/2021 for the evaluation of diaphoresis, dyspnea, and left arm pain with [facility]KG [facility] demonstrating inferior STEMI.. ?  ?Principal Problem: ?  Acute ST elevation myocardial infarction (STEMI) of inferior wall (HCC) ?Active Problems: ?  Coronary artery disease involving native coronary artery of native heart with unstable angina pectoris ([place]) ?  Status post insertion of drug eluting coronary artery stent ?  Hyperlipidemia associated with type 2 diabetes mellitus ([place]) ?  Essential hypertension ?  CKD (chronic kidney disease) stage 3, GFR 30-59 ml/min (HCC) ? ? ?Subjective  ? ?No more chest pain.  Somewhat grouchy.  Upset about not going home.  Has concerns about his bowel movements and his history of Crohn's.  Does not like being in the hospital.  Feels that he is being caged. ? ?Inpatient Medications  ?  ?Scheduled Meds: ? amLODipine  10 mg Oral Daily  ? aspirin  81 mg Oral Daily  ? atorvastatin  80 mg Oral Daily  ? glimepiride  1 mg Oral Q breakfast  ? heparin  5,000 Units Subcutaneous Q8H  ? insulin aspart  0-9 Units Subcutaneous TID WC  ? metoprolol succinate  50 mg Oral Daily  ? sodium chloride flush  3 mL Intravenous Q12H  ? ticagrelor  90 mg Oral BID  ? ?Continuous Infusions: ? sodium chloride 999 mL/hr (09/11/21 1323)  ? sodium chloride    ? ?PRN Meds: ?sodium chloride, acetaminophen, ondansetron (ZOFRAN) IV, oxyCODONE, sodium chloride flush  ? ?Vital Signs  ?  ?Vitals:  ? 09/12/21 0600 09/12/21 0737 09/12/21 0800 09/12/21 0900  ?BP: (!) 151/80  115/86 (!) 163/83  ?Pulse: 75  80 88  ?Resp:   20   ?Temp:  98.9 ?F (37.2 ?C)    ?TempSrc:  Oral    ?SpO2: 94%  96% 95%  ?Weight:      ?Height:       ? ? ?Intake/Output Summary (Last 24 hours) at 09/12/2021 0944 ?Last data filed at 09/12/2021 0600 ?Gross per 24 hour  ?Intake 1345.16 ml  ?Output --  ?Net 1345.16 ml  ? ? ?  09/11/2021  ? 10:57 AM 03/15/2021  ? 10:00 AM 09/13/2020  ? 10:56 AM  ?Last 3 Weights  ?Weight (lbs) 190 lb 199 lb 12.8 oz 208 lb 3.2 oz  ?Weight (kg) 86.183 kg 90.629 kg 94.439 kg  ?   ? ?Telemetry  ?  ?Sinus rhythm heart rates in the 80s.  PVCs noted.  No arrhythmias.- Personally Reviewed ? ?ECG  ?  ?Sinus rhythm, rate 85 bpm.  Left ex deviation (-40 ?, inferior posterior MI, likely recent having Q waves with still mild residual ST elevations as evolutionary changes of inferior MI.) - Personally Reviewed ? ?Physical Exam  ? ?GEN: No acute distress.  Grouchy, upset ?Neck: No JVD or bruit ?Cardiac: RRR, soft 1/6 SEM at RUSB.  Otherwise no murmurs rubs or gallops.  ?Respiratory: Clear to auscultation bilaterally.  Nonlabored, good air movement. ?GI: Soft, nontender, non-distended  ?MS: No edema; No deformity. ?Neuro:  Nonfocal  ?Psych: Normal affect  ? ?Labs  ?  ?High Sensitivity Troponin:   ?Recent Labs  ?Lab 09/11/21 ?1107 09/11/21 ?1440 09/11/21 ?1651 09/11/21 ?2150  ?  TROPONINIHS 5 18,442* >24,000* >24,000*  ?   ?Chemistry ?Recent Labs  ?Lab 09/11/21 ?1107 09/11/21 ?1440 09/12/21 ?0352  ?NA 132*  --  131*  ?K 3.9  --  3.7  ?CL 103  --  105  ?CO2 18*  --  17*  ?GLUCOSE 251*  --  196*  ?BUN 22  --  16  ?CREATININE 1.65* 1.55* 1.44*  ?CALCIUM 8.9  --  8.1*  ?PROT 8.0  --   --   ?ALBUMIN 3.8  --   --   ?AST 21  --   --   ?ALT 27  --   --   ?ALKPHOS 86  --   --   ?BILITOT 0.5  --   --   ?GFRNONAA 44* 47* 52*  ?ANIONGAP 11  --  9  ?  ?Lipids  ?Recent Labs  ?Lab 09/11/21 ?1440  ?CHOL 136  ?TRIG 43  ?HDL 24*  ?LDLCALC 103*  ?CHOLHDL 5.7  ?  ?Hematology ?Recent Labs  ?Lab 09/11/21 ?1107 09/12/21 ?0352  ?WBC 11.7* 6.9  ?RBC 5.76 4.73  ?HGB 14.3 12.0*  ?HCT 46.2 37.4*  ?MCV 80.2 79.1*  ?MCH 24.8* 25.4*  ?MCHC 31.0 32.1  ?RDW 14.4 14.3  ?PLT 417* 385   ? ?Thyroid  ?Recent Labs  ?Lab 09/11/21 ?1440  ?TSH 1.625  ?  ?BNP ?Recent Labs  ?Lab 09/11/21 ?1440  ?BNP 13.5  ?  ?DDimer No results for input(s): DDIMER in the last 168 hours.  ? ?Radiology  ?  ?CARDIAC CATHETERIZATION ? ?Result Date: 09/11/2021 ?CONCLUSIONS: Subtotal occlusion of the distal RCA treated with overlapping Synergy stents postdilated to 3.5 mm in diameter with improvement in TIMI II flow to TIMI-3 flow with less than 10% residual stenosis.  Patient did have protrusion of thrombus that was not obstructive.  IV Aggrastat was started.  The patient had been loaded with ticagrelor.  Aspirin will be continued. Patent proximal RCA stent Patent proximal LAD stent Diffuse in-stent restenosis in the mid to distal circumflex. Significant inferior wall and basal hypokinesis with estimated EF 35 to 40%.  LVEDP 18 mmHg. RECOMMENDATIONS: Risk factor modification including aggressive lipid-lowering. Circumflex in-stent restenosis is present however the vessel appears to be relatively small and diffusely diseased.  I would recommend treating this with conservative medical management. IV Aggrastat infusion x18 hours then discontinue.  ? ?DG Chest Port 1 View ? ?Result Date: 09/11/2021 ?CLINICAL DATA:  Chest pain EXAM: PORTABLE CHEST 1 VIEW COMPARISON:  02/05/2008 FINDINGS: The heart size and mediastinal contours are within normal limits. Aortic atherosclerosis. No focal airspace consolidation, pleural effusion, or pneumothorax. Degenerative changes of the thoracic spine and shoulders. IMPRESSION: No active disease. Electronically Signed   By: Davina Poke D.O.   On: 09/11/2021 11:25   ? ?Cardiac Studies  ? ?Cardiac Cath Diagrams: (09/11/2021): PCI: Overlapping Synergy DES 3.0 mm to 20 mm and 3.5 mm x 8 mm-postdilated to 3.6 mm. ?DiagnosticDominance: Right      Intervention ? ? ? ? ?Echo pending ? ?Assessment & Plan  ?  ? ? ? ? ? ?Problem Assessment/Plan ?Genitourinary ?Stage 3a chronic kidney disease (CKD)  ([place]) ?Assessment & Plan ?Lab Results  ?Component Value Date  ? CREATININE 1.44 (H) 09/12/2021  ? CREATININE 1.55 (H) 09/11/2021  ? CREATININE 1.65 (H) 09/11/2021  ? ?Creatinine seems to be stable.  Monitor closely post cath. ?We will hold off on starting ARB until we are sure that his creatinine is stable. ? ? ? ?DM-2 -> on  sulfonylurea and sliding scale insulin.  Need to consider the possibility of SGLT2 inhibitor on discharge.  Will discuss with pharmacy. ? ? ?For questions or updates, please contact [place] ?Please consult [email or web address] for contact info under  ? ?  ?   ?Signed, ?Glenetta Hew, MD  ?09/12/2021, 9:44 AM    ? ?

## 2021-09-12 NOTE — Assessment & Plan Note (Signed)
History of known CAD now with severe mid to distal RCA disease in the setting inferior STEMI.  2 overlapping DES stents placed. ? ?Continue DAPT aspirin and Brilinta ?Atorvastatin 80 mg daily ?On Toprol 50 mg along with amlodipine 10 mg ?Pending renal evaluation, anticipate starting ARB. ? ?

## 2021-09-12 NOTE — Assessment & Plan Note (Signed)
Has ups and downs of blood pressures.  Currently 163/83 but was in the 110s earlier this morning.  Heart rate stable in the 80s.  We will continue to monitor blood pressures over the course of the day.  Would consider further titration of beta-blocker or potentially adding ARB depending on echo findings. ? ? ?

## 2021-09-12 NOTE — Assessment & Plan Note (Signed)
2 overlapping stents in the RCA.  Was treated with 18 hours of Aggrastat that she completed this morning. ? ?Will be on aspirin and Brilinta for minimum 1 year.  Likely reduced Thienopyridine dose and continue for second year and discontinue aspirin.  Second year can be interrupted. ?

## 2021-09-12 NOTE — Progress Notes (Signed)
CARDIAC REHAB PHASE I  ? ?Offered to walk with pt. Pt declines at this time. Pt educated on importance of ASA and Brilinta. Pt states issues with blood thinners in the past and has a history of GI issues. Pt also states issues with statin in the past with muscle aches. Pt given MI book along with heart healthy and diabetic diets. Reviewed site care, restrictions, and exercise guidelines. Pt upset he has to stay another night, anxious to d/c. Will continue to follow. ? ?[id number] ?Rufina Falco, RN BSN ?09/12/2021 ?11:49 AM ? ?

## 2021-09-12 NOTE — Progress Notes (Signed)
?  Echocardiogram ?2D Echocardiogram has been performed. ? ?John Cuevas ?09/12/2021, 2:44 PM ?

## 2021-09-12 NOTE — TOC Benefit Eligibility Note (Signed)
Patient Advocate Encounter ? ?Insurance verification completed.   ? ?The patient is currently admitted and upon discharge could be taking Farxiga 10 mg. ? ?The current 30 day co-pay is, $47.00.  ? ?The patient is currently admitted and upon discharge could be taking Jardiance 10 mg. ? ?The current 30 day co-pay is, $47.00.  ? ?The patient is currently admitted and upon discharge could be taking Brilinta 90 mg. ? ?The current 30 day co-pay is, $47.00.  ? ?The patient is insured through [employer] Part D  ? ? ? ?Lyndel Safe, CPhT ?Pharmacy Patient Advocate Specialist ?[place] Patient Advocate Team ?Direct Number: [phone]  Fax: [phone] ? ? ? ? ? ?  ?

## 2021-09-12 NOTE — Assessment & Plan Note (Signed)
Lab Results  ?Component Value Date  ? CHOL 136 09/11/2021  ? HDL 24 (L) 09/11/2021  ? LDLCALC 103 (H) 09/11/2021  ? TRIG 43 09/11/2021  ? CHOLHDL 5.7 09/11/2021  ? ?LDL 103.  On atorvastatin 80 mg daily now.  May require additional therapy with close follow-up in the outpatient setting, consider lipid clinic evaluation. ?

## 2021-09-12 NOTE — Subjective & Objective (Signed)
? ?Progress Note ? ?Patient Name: John Cuevas ?Date of Encounter: 09/12/2021 ? ?[facility] Cardiologist: Lauree Chandler, MD  ? ?Patient Profile  ?   ?71 y.o. male male with history of CAD with RCA stent 2009 and circumflex and LAD stents 2011 who is being seen 09/11/2021 for the evaluation of diaphoresis, dyspnea, and left arm pain [facility] inferior STEMI.. ?  ?Principal Problem: ?  Acute ST elevation myocardial infarction (STEMI) of inferior wall (HCC) ?Active Problems: ?  Coronary artery disease involving native coronary artery of native heart with unstable angina pectoris ([place]) ?  Status post insertion of drug eluting coronary artery stent ?  Hyperlipidemia associated with type 2 diabetes mellitus ([place]) ?  Essential hypertension ?  CKD (chronic kidney disease) stage 3, GFR 30-59 ml/min (HCC) ? ? ?Subjective  ? ?No more chest pain.  Somewhat grouchy.  Upset about not going home.  Has concerns about his bowel movements and his history of Crohn's.  Does not like being in the hospital.  Feels that he is being caged. ? ?Inpatient Medications  ?  ?Scheduled Meds: ? amLODipine  10 mg Oral Daily  ? aspirin  81 mg Oral Daily  ? atorvastatin  80 mg Oral Daily  ? glimepiride  1 mg Oral Q breakfast  ? heparin  5,000 Units Subcutaneous Q8H  ? insulin aspart  0-9 Units Subcutaneous TID WC  ? metoprolol succinate  50 mg Oral Daily  ? sodium chloride flush  3 mL Intravenous Q12H  ? ticagrelor  90 mg Oral BID  ? ?Continuous Infusions: ? sodium chloride 999 mL/hr (09/11/21 1323)  ? sodium chloride    ? ?PRN Meds: ?sodium chloride, acetaminophen, ondansetron (ZOFRAN) IV, oxyCODONE, sodium chloride flush  ? ?Vital Signs  ?  ?Vitals:  ? 09/12/21 0600 09/12/21 0737 09/12/21 0800 09/12/21 0900  ?BP: (!) 151/80  115/86 (!) 163/83  ?Pulse: 75  80 88  ?Resp:   20   ?Temp:  98.9 ?F (37.2 ?C)    ?TempSrc:  Oral    ?SpO2: 94%  96% 95%  ?Weight:      ?Height:      ? ? ?Intake/Output  Summary (Last 24 hours) at 09/12/2021 0944 ?Last data filed at 09/12/2021 0600 ?Gross per 24 hour  ?Intake 1345.16 ml  ?Output --  ?Net 1345.16 ml  ? ? ?  09/11/2021  ? 10:57 AM 03/15/2021  ? 10:00 AM 09/13/2020  ? 10:56 AM  ?Last 3 Weights  ?Weight (lbs) 190 lb 199 lb 12.8 oz 208 lb 3.2 oz  ?Weight (kg) 86.183 kg 90.629 kg 94.439 kg  ?   ? ?Telemetry  ?  ?Sinus rhythm heart rates in the 80s.  PVCs noted.  No arrhythmias.- Personally Reviewed ? ?ECG  ?  ?Sinus rhythm, rate 85 bpm.  Left ex deviation (-40 ?, inferior posterior MI, likely recent having Q waves with still mild residual ST elevations as evolutionary changes of inferior MI.) - Personally Reviewed ? ?Physical Exam  ? ?GEN: No acute distress.  Grouchy, upset ?Neck: No JVD or bruit ?Cardiac: RRR, soft 1/6 SEM at RUSB.  Otherwise no murmurs rubs or gallops.  ?Respiratory: Clear to auscultation bilaterally.  Nonlabored, good air movement. ?GI: Soft, nontender, non-distended  ?MS: No edema; No deformity. ?Neuro:  Nonfocal  ?Psych: Normal affect  ? ?Labs  ?  ?High Sensitivity Troponin:   ?Recent Labs  ?Lab 09/11/21 ?1107 09/11/21 ?1440 09/11/21 ?1651 09/11/21 ?2150  ?TROPONINIHS  5 18,442* >24,000* >24,000*  ?   ?Chemistry ?Recent Labs  ?Lab 09/11/21 ?1107 09/11/21 ?1440 09/12/21 ?0352  ?NA 132*  --  131*  ?K 3.9  --  3.7  ?CL 103  --  105  ?CO2 18*  --  17*  ?GLUCOSE 251*  --  196*  ?BUN 22  --  16  ?CREATININE 1.65* 1.55* 1.44*  ?CALCIUM 8.9  --  8.1*  ?PROT 8.0  --   --   ?ALBUMIN 3.8  --   --   ?AST 21  --   --   ?ALT 27  --   --   ?ALKPHOS 86  --   --   ?BILITOT 0.5  --   --   ?GFRNONAA 44* 47* 52*  ?ANIONGAP 11  --  9  ?  ?Lipids  ?Recent Labs  ?Lab 09/11/21 ?1440  ?CHOL 136  ?TRIG 43  ?HDL 24*  ?LDLCALC 103*  ?CHOLHDL 5.7  ?  ?Hematology ?Recent Labs  ?Lab 09/11/21 ?1107 09/12/21 ?0352  ?WBC 11.7* 6.9  ?RBC 5.76 4.73  ?HGB 14.3 12.0*  ?HCT 46.2 37.4*  ?MCV 80.2 79.1*  ?MCH 24.8* 25.4*  ?MCHC 31.0 32.1  ?RDW 14.4 14.3  ?PLT 417* 385  ? ?Thyroid  ?Recent Labs   ?Lab 09/11/21 ?1440  ?TSH 1.625  ?  ?BNP ?Recent Labs  ?Lab 09/11/21 ?1440  ?BNP 13.5  ?  ?DDimer No results for input(s): DDIMER in the last 168 hours.  ? ?Radiology  ?  ?CARDIAC CATHETERIZATION ? ?Result Date: 09/11/2021 ?CONCLUSIONS: Subtotal occlusion of the distal RCA treated with overlapping Synergy stents postdilated to 3.5 mm in diameter with improvement in TIMI II flow to TIMI-3 flow with less than 10% residual stenosis.  Patient did have protrusion of thrombus that was not obstructive.  IV Aggrastat was started.  The patient had been loaded with ticagrelor.  Aspirin will be continued. Patent proximal RCA stent Patent proximal LAD stent Diffuse in-stent restenosis in the mid to distal circumflex. Significant inferior wall and basal hypokinesis with estimated EF 35 to 40%.  LVEDP 18 mmHg. RECOMMENDATIONS: Risk factor modification including aggressive lipid-lowering. Circumflex in-stent restenosis is present however the vessel appears to be relatively small and diffusely diseased.  I would recommend treating this with conservative medical management. IV Aggrastat infusion x18 hours then discontinue.  ? ?DG Chest Port 1 View ? ?Result Date: 09/11/2021 ?CLINICAL DATA:  Chest pain EXAM: PORTABLE CHEST 1 VIEW COMPARISON:  02/05/2008 FINDINGS: The heart size and mediastinal contours are within normal limits. Aortic atherosclerosis. No focal airspace consolidation, pleural effusion, or pneumothorax. Degenerative changes of the thoracic spine and shoulders. IMPRESSION: No active disease. Electronically Signed   By: Davina Poke D.O.   On: 09/11/2021 11:25   ? ?Cardiac Studies  ? ?Cardiac Cath Diagrams: (09/11/2021): PCI: Overlapping Synergy DES 3.0 mm to 20 mm and 3.5 mm x 8 mm-postdilated to 3.6 mm. ?DiagnosticDominance: Right      Intervention ? ? ? ? ?Echo pending ? ?Assessment & Plan  ?  ? ? ? ? ?

## 2021-09-12 NOTE — Progress Notes (Signed)
Inpatient Diabetes Program Recommendations ? ?AACE/ADA: New Consensus Statement on Inpatient Glycemic Control (2015) ? ?Target Ranges:  Prepandial:   less than 140 mg/dL ?     Peak postprandial:   less than 180 mg/dL (1-2 hours) ?     Critically ill patients:  140 - 180 mg/dL  ? ?Lab Results  ?Component Value Date  ? GLUCAP 219 (H) 09/12/2021  ? HGBA1C 8.2 (H) 09/11/2021  ? ? ?Review of Glycemic Control ? Latest Reference Range & Units 09/11/21 11:15 09/11/21 15:53 09/11/21 21:19 09/12/21 07:40  ?Glucose-Capillary 70 - 99 mg/dL 244 (H) 183 (H) 164 (H) 219 (H)  ? ?Diabetes history: DM 2 ?Outpatient Diabetes medications:  ?Amaryl 1 mg daily ? ?Current orders for Inpatient glycemic control:  ?Amaryl 1 mg daily ?Novolog sensitive tid with meals  ? ?Inpatient Diabetes Program Recommendations:   ? ?Note A1C >goal. May benefit from the addition of another oral agent for diabetes such as an SGLT-2? ? ?Thanks,  ?Adah Perl, RN, BC-ADM ?Inpatient Diabetes Coordinator ?Pager [phone]  (8a-5p) ? ? ?

## 2021-09-12 NOTE — Assessment & Plan Note (Signed)
Lab Results  ?Component Value Date  ? CREATININE 1.44 (H) 09/12/2021  ? CREATININE 1.55 (H) 09/11/2021  ? CREATININE 1.65 (H) 09/11/2021  ? ?Creatinine seems to be stable.  Monitor closely post cath. ?We will hold off on starting ARB until we are sure that his creatinine is stable. ?

## 2021-09-12 NOTE — Progress Notes (Signed)
0700 - Report received from [place], [place].  All questions answered.  Safety checks performed.  All lines and drips verified. ?Hand hygiene performed before/after each pt contact. ?0800 - Assessment & Rx.  Pt advised that he would like to go home today.  We discussed that the doctors have to round before we can talk about discharge.  He advised that he understood.  Pt also expressed difficulty affording test strips for his glucometer.   ?[place], Case Manager sent a secure chat reference pt's concern about the cost of test strips.  She advised that they are inexpensive at [facility] and pt's Medicare should help him afford them.  Pt updated with that information. ?[id number] - Dr. Ellyn Hack rounded.  He advised that pt probably would not go home today, but likely tomorrow.   ?1100 - Pt ambulated to/from bathroom without assistance.  Pt w/BM.  Pt advised that his BM was less bloody than the last one. ?1215 - Report given to Lattie Haw, Therapist, sports.  All questions answered. ?

## 2021-09-12 NOTE — Progress Notes (Signed)
Patient brought to 4E from 2H. Telemetry box applied, CCMD notified. VSS. Patient states pain 0/10 on pain scale. Patient oriented to staff and room. Call bell in reach. ? ?Kenard Gower, RN  ?

## 2021-09-13 ENCOUNTER — Other Ambulatory Visit (HOSPITAL_COMMUNITY): Payer: Self-pay

## 2021-09-13 DIAGNOSIS — E118 Type 2 diabetes mellitus with unspecified complications: Secondary | ICD-10-CM

## 2021-09-13 LAB — GLUCOSE, CAPILLARY: Glucose-Capillary: 188 mg/dL — ABNORMAL HIGH (ref 70–99)

## 2021-09-13 MED ORDER — NITROGLYCERIN 0.4 MG SL SUBL: 0.4000 mg | SUBLINGUAL_TABLET | SUBLINGUAL | 2 refills | Status: DC | PRN

## 2021-09-13 MED ORDER — TICAGRELOR 90 MG PO TABS
90.0000 mg | ORAL_TABLET | Freq: Two times a day (BID) | ORAL | 2 refills | Status: DC
  Filled 2021-09-13: qty 60, 30d supply, fill #0

## 2021-09-13 MED ORDER — ATORVASTATIN CALCIUM 80 MG PO TABS: 80.0000 mg | ORAL_TABLET | Freq: Every day | ORAL | 1 refills | Status: DC

## 2021-09-13 MED ORDER — TICAGRELOR 90 MG PO TABS: 90.0000 mg | ORAL_TABLET | Freq: Two times a day (BID) | ORAL | 2 refills | Status: DC

## 2021-09-13 MED ORDER — NITROGLYCERIN 0.4 MG SL SUBL
0.4000 mg | SUBLINGUAL_TABLET | SUBLINGUAL | 2 refills | Status: AC | PRN
  Filled 2021-09-13: qty 25, 30d supply, fill #0

## 2021-09-13 MED ORDER — ATORVASTATIN CALCIUM 80 MG PO TABS
80.0000 mg | ORAL_TABLET | Freq: Every day | ORAL | 1 refills | Status: DC
  Filled 2021-09-13: qty 30, 30d supply, fill #0

## 2021-09-13 NOTE — Discharge Summary (Addendum)
?Discharge Summary  ?  ?Patient ID: CHRISTA Cuevas ?MRN: [id number]; DOB: July 10, 1950 ? ?Admit date: 09/11/2021 ?Discharge date: 09/13/2021 ? ?PCP:  John Beach, MD ?  ?[facility] Providers ?Cardiologist:  John Chandler, MD    ? ?Discharge Diagnoses  ?  ?Principal Problem: ?  Acute ST elevation myocardial infarction (STEMI) of inferior wall (HCC) ?Active Problems: ?  Coronary artery disease involving native coronary artery of native heart with unstable angina pectoris ([place]) ?  Status post insertion of drug eluting coronary artery stent ?  Hyperlipidemia associated with type 2 diabetes mellitus ([place]) ?  Essential hypertension ?  Stage 3a chronic kidney disease (CKD) ([place]) ?  Ischemic cardiomyopathy ?  Type 2 diabetes mellitus with complication, without long-term current use of insulin ([place]) ? ? ?Diagnostic Studies/Procedures  ?  ?Cath: 09/11/21 ? ?CONCLUSIONS: ?Subtotal occlusion of the distal RCA treated with overlapping Synergy stents postdilated to 3.5 mm in diameter with improvement in TIMI II flow to TIMI-3 flow with less than 10% residual stenosis.  Patient did have protrusion of thrombus that was not obstructive.  IV Aggrastat was started.  The patient had been loaded with ticagrelor.  Aspirin will be continued. ?Patent proximal RCA stent ?Patent proximal LAD stent ?Diffuse in-stent restenosis in the mid to distal circumflex. ?Significant inferior wall and basal hypokinesis with estimated EF 35 to 40%.  LVEDP 18 mmHg. ?  ?RECOMMENDATIONS: ?  ?Risk factor modification including aggressive lipid-lowering. ?Circumflex in-stent restenosis is present however the vessel appears to be relatively small and diffusely diseased.  I would recommend treating this with conservative medical management. ?IV Aggrastat infusion x18 hours then discontinue. ? ?Diagnostic ?Dominance: Right ?Intervention ? ? ?Echo: 09/12/21 ? ?IMPRESSIONS  ? ? ? 1. Left ventricular ejection fraction, by estimation, is 55 to 60%. The   ?left ventricle has normal function. Left ventricular endocardial border  ?not optimally defined to evaluate regional wall motion. There is mild  ?concentric left ventricular  ?hypertrophy. Left ventricular diastolic parameters are consistent with  ?Grade I diastolic dysfunction (impaired relaxation).  ? 2. Right ventricular systolic function is normal. The right ventricular  ?size is normal. Tricuspid regurgitation signal is inadequate for assessing  ?PA pressure.  ? 3. The mitral valve is grossly normal. No evidence of mitral valve  ?regurgitation. No evidence of mitral stenosis.  ? 4. The aortic valve is tricuspid. There is mild calcification of the  ?aortic valve. Aortic valve regurgitation is not visualized. No aortic  ?stenosis is present.  ? 5. Aortic dilatation noted. 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  ? 6. The inferior vena cava is normal in size with greater than 50%  ?respiratory variability, suggesting right atrial pressure of 3 mmHg.  ? ?FINDINGS  ? Left Ventricle: Left ventricular ejection fraction, by estimation, is 55  ?to 60%. The left ventricle has normal function. Left ventricular  ?endocardial border not optimally defined to evaluate regional wall motion.  ?The left ventricular internal cavity  ?size was normal in size. There is mild concentric left ventricular  ?hypertrophy. Left ventricular diastolic parameters are consistent with  ?Grade I diastolic dysfunction (impaired relaxation).  ? ?Right Ventricle: The right ventricular size is normal. No increase in  ?right ventricular wall thickness. Right ventricular systolic function is  ?normal. Tricuspid regurgitation signal is inadequate for assessing PA  ?pressure.  ? ?Left Atrium: Left atrial size was normal in size.  ? ?Right Atrium: Right atrial size was normal in size.  ? ?  Pericardium: There is no evidence of pericardial effusion.  ? ?Mitral Valve: The mitral valve is grossly normal. No evidence of mitral   ?valve regurgitation. No evidence of mitral valve stenosis.  ? ?Tricuspid Valve: The tricuspid valve is grossly normal. Tricuspid valve  ?regurgitation is trivial. No evidence of tricuspid stenosis.  ? ?Aortic Valve: The aortic valve is tricuspid. There is mild calcification  ?of the aortic valve. Aortic valve regurgitation is not visualized. No  ?aortic stenosis is present.  ? ?Pulmonic Valve: The pulmonic valve was grossly normal. Pulmonic valve  ?regurgitation is not visualized. No evidence of pulmonic stenosis.  ? ?Aorta: Aortic dilatation noted. 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  ? ?Venous: The inferior vena cava is normal in size with greater than 50%  ?respiratory variability, suggesting right atrial pressure of 3 mmHg.  ? ?IAS/Shunts: The atrial septum is grossly normal.  ?_____________ ?  ?History of Present Illness   ?  ?John Cuevas is a 71 y.o. male with history of CAD with RCA stent 2009 and circumflex and LAD stents 2011 who is being seen 09/11/2021 for the evaluation of diaphoresis, dyspnea, and left arm pain [facility] demonstrating [facility] STEMI. Mr. John Cuevas he was exercising the morning of admission around 830 or 9:00 AM and began having diaphoresis, nausea, and ultimately chest discomfort with radiation into the left arm.  He went to the emergency room where he was found to have ST elevation in the inferior leads.  Symptoms of been ongoing since that time. ?  ?History of statin intolerance.  Had difficulty with "blood thinners in the past".  Multiple other complaints related to arthritis and Crohn's disease. ? ?Hospital Course  ?   ?STEMI: Underwent cardiac catheterization subtotal occlusion of distal RCA treated with overlapping stents.  Did have protrusion of thrombus that was not obstructive.  Was treated with IV Aggrastat.  Placed on DAPT with aspirin/Brilinta for at least 1 year.  Noted to have patent proximal RCA stent as well  as proximal LAD stent.  Diffuse in-stent restenosis of the mid to distal circumflex.  Seen by cardiac rehab with no recurrent chest pain. ?--Continue aspirin, Brilinta, Norvasc 10 mg daily, metoprolol XL 50 mg daily ? ?HFpEF: Initial EF noted on LV gram 35 to 40%.  Follow-up echocardiogram showed LVEF of 55 to [id number] grade 1 diastolic dysfunction, normal RV.  No volume overload on exam ?--Continue beta-blocker ? ?HTN: Well controlled on Toprol XL and Norvasc ? ?HLD: LDL 103 ?--Agreeable to trial Lipitor 80 mg daily.  Does report issues with myalgias in the past ?-- We will send referral to lipid clinic at discharge ? ?CKD stage 3a: baseline around 1.4>>1.5. Stable at discharge  ? ?DM: continue glimepiride at discharge ?-- consider SLGT2 as an outpatient if feasible from cost stand point ? ?General: Well developed, well nourished, male appearing in no acute distress. ?Head: Normocephalic, atraumatic.  ?Neck: Supple without bruits, JVD. ?Lungs:  Resp regular and unlabored, CTA. ?Heart: RRR, S1, S2, no S3, S4, or murmur; no rub. ?Abdomen: Soft, non-tender, non-distended with normoactive bowel sounds. No hepatomegaly. No rebound/guarding. No obvious abdominal masses. ?Extremities: No clubbing, cyanosis, edema. Distal pedal pulses are 2+ bilaterally. Right radial cath site stable but with bruising extending up forearm. No hematoma  ?Neuro: Alert and oriented X 3. Moves all extremities spontaneously. ?Psych: Normal affect. ? ? ?Did the patient have an acute coronary syndrome (MI, NSTEMI, STEMI, etc) this  admission?:  Yes                              ? ?AHA/ACC Clinical Performance & Quality Measures: ?Aspirin prescribed? - Yes ?ADP Receptor Inhibitor (Plavix/Clopidogrel, Brilinta/Ticagrelor or Effient/Prasugrel) prescribed (includes medically managed patients)? - Yes ?Beta Blocker prescribed? - Yes ?High Intensity Statin (Lipitor 40-80mg  or Crestor 20-40mg ) prescribed? - Yes ?EF assessed during THIS hospitalization? -  Yes ?For EF <40%, was ACEI/ARB prescribed? - Not Applicable (EF >/= [id number]) ?For EF <40%, Aldosterone Antagonist (Spironolactone or Eplerenone) prescribed? - Not Applicable (EF >/= [id number]) ?Cardiac Rehab Phase II ord

## 2021-09-13 NOTE — Assessment & Plan Note (Signed)
Lab Results  ?Component Value Date  ? CREATININE 1.44 (H) 09/12/2021  ? CREATININE 1.55 (H) 09/11/2021  ? CREATININE 1.65 (H) 09/11/2021  ? ?Creatinine seems to be stable.  Monitor closely post cath. ?We will hold off on starting ARB until we are sure that his creatinine is stable. ?

## 2021-09-13 NOTE — Progress Notes (Signed)
D/C instructions given to patient. Wound care and medications reviewed, all questions answered. IV removed, clean and intact. TOC pharmacy to deliver home medications. ? ?[name], RN ? ?

## 2021-09-13 NOTE — Progress Notes (Signed)
CARDIAC REHAB PHASE I  ? ?PRE:  Rate/Rhythm: 98 SR ? ?BP:  Sitting: 111/52     ? ?SaO2: 97 RA ? ?MODE:  Ambulation: 300 ft  ? ?POST:  Rate/Rhythm: 116 ST ? ?BP:  Sitting: 163/87 ? ?  SaO2: 95 RA ? ? ?Pt ambulated 317ft in hallway standby assist with steady gait. Pt denies CP, SOB, or dizziness. Reinforced importance of ASA and Brilinta. Reviewed site care, restrictions, and exercise guidelines. Will refer to CRP II [place] to meet the requirements, but pt is not interested in attending.  ? ?[id number] ?Rufina Falco, RN BSN ?09/13/2021 ?9:39 AM ? ?

## 2021-09-13 NOTE — TOC Transition Note (Signed)
Transition of Care (TOC) - CM/SW Discharge Note ?Donn Pierini Charity fundraiser, BSN ?Transitions of Care ?Unit 4E- RN Case Manager ?See Treatment Team for direct phone #  ? ? ?Patient Details  ?Name: John Cuevas ?MRN: [id number] ?Date of Birth: December 23, 1950 ? ?Transition of Care (TOC) CM/SW Contact:  ?Zenda Alpers, Lenn Sink, RN ?Phone Number: ?09/13/2021, 11:58 AM ? ? ?Clinical Narrative:    ?Pt stable for transition home today. Admitted w/ Stemi s/p cardiac cath with stents. New Brilinta, copay per benefits check $47. TOC pharmacy to deliver meds to bedside. ?No further TOC needs noted. Family to transport home ? ? ?Final next level of care: Home/Self Care ?Barriers to Discharge: No Barriers Identified ? ? ?Patient Goals and CMS Choice ?  ?  ?Choice offered to / list presented to : NA ? ?Discharge Placement ?  ?           ? Home ?  ?  ?  ? ?Discharge Plan and Services ?  ?  ?           ?DME Arranged: N/A ?DME Agency: NA ?  ?  ?  ?HH Arranged: NA ?HH Agency: NA ?  ?  ?  ? ?Social Determinants of Health (SDOH) Interventions ?  ? ? ?Readmission Risk Interventions ? ?  09/13/2021  ? 11:58 AM  ?Readmission Risk Prevention Plan  ?Post Dischage Appt Complete  ?Medication Screening Complete  ?Transportation Screening Complete  ? ? ? ? ? ?

## 2021-09-19 ENCOUNTER — Telehealth (HOSPITAL_COMMUNITY): Payer: Self-pay

## 2021-09-19 ENCOUNTER — Other Ambulatory Visit (HOSPITAL_COMMUNITY): Payer: Self-pay

## 2021-09-19 NOTE — Telephone Encounter (Signed)
Pharmacy Transitions of Care Follow-up Telephone Call ? ?Date of discharge: 09/13/21  ?Discharge Diagnosis: STEMI ? ?How have you been since you were released from the hospital? Patient doing well since discharge, no questions about meds at this time.  ? ?Medication changes made at discharge: ?    START taking: ?atorvastatin (LIPITOR)  ?Brilinta (ticagrelor)  ?nitroGLYCERIN (Nitrostat)  ?STOP taking: ?ENTYVIO IV  ?testosterone cypionate 200 MG/ML injection (DEPOTESTOSTERONE CYPIONATE ? ?Medication changes verified by the patient? Yes ?  ? ?Medication Accessibility: ? ?Home Pharmacy: [facility]  ? ?Was the patient provided with refills on discharged medications? Yes  ? ?Have all prescriptions been transferred from [facility] to home pharmacy? No, patient does not want transfers at this time  ? ?Is the patient able to afford medications? Has insurance ?  ? ?Medication Review: ? ?TICAGRELOR (BRILINTA) ?Ticagrelor 90 mg BID initiated on 09/13/21.  ?- Discussed importance of taking medication around the same time every day, ?- Advised patient of medications to avoid (NSAIDs, aspirin maintenance doses>100 mg daily) ?- Educated that Tylenol (acetaminophen) will be the preferred analgesic to prevent risk of bleeding  ?- Emphasized importance of monitoring for signs and symptoms of bleeding (abnormal bruising, prolonged bleeding, nose bleeds, bleeding from gums, discolored urine, black tarry stools)  ?- Educated patient to notify doctor if shortness of breath or abnormal heartbeat occur ?- Advised patient to alert all providers of antiplatelet therapy prior to starting a new medication or having a procedure  ? ?Follow-up Appointments: ? ?PCP Hospital f/u appt confirmed? Scheduled to see PCP on 09/26/21 ? ?[facility] f/u appt confirmed? Scheduled to see Dr. Ann Maki on 09/25/21 @ 8:45AM.  ? ?If their condition worsens, is the pt aware to call PCP or go to the Emergency Dept.? Yes ? ?Final Patient Assessment: ?Patient has f/u  scheduled and will get refills at follow up ? ?

## 2021-09-23 NOTE — Progress Notes (Addendum)
?Cardiology Office Note:   ? ?Date:  09/25/2021  ? ?ID:  BRIER CHROSTOWSKI, DOB 27-May-1951, MRN [id number] ? ?PCP:  Larene Beach, MD ?  ?[facility] Providers ?Cardiologist:  Lauree Chandler, MD   ? ?Referring MD: Larene Beach, MD  ? ?Chief Complaint: follow-up CAD s/p DES to distal RCA ? ?History of Present Illness:   ? ?John Cuevas is a 71 y.o. male with a hx of CAD, hypertension, vertebral and carotid artery disease, chronic HFpEF, hyperlipidemia, diabetes, Crohn's disease, GERD, chronic neck and back pain.  ? ?He had DES placed in proximal RCA in 2009 and DES in LAD and OM in 2011.  He has refused statins because of joint aches and has not wish to consider alternative therapies. Multiple drug intolerances related to myalgia or GI symptoms.  ? ?He was last seen in our office on 03/15/21 by Dr. Angelena Form and 12 month follow-up was recommended. Carotid ultrasound revealed mild carotid artery disease, known occlusion of the right vertebral artery.  ? ?He presented to [facility] 09/11/21 with chest pain which started while he was exercising, he broke into a sweat and developed chest burning similar to prior MI.  Some radiation into his arm as well as nausea, shortness of breath. Reported that pain was severe.  EKG showed ST elevation in the inferior leads and patient was transferred to [facility] for emergent cardiac catheterization. Cath revealed subtotal occlusion of the distal RCA treated with overlapping Synergy stents with improved flow to less than 10% residual stenosis.  Patient had protrusion of thrombus that was not obstructive.  Patent proximal RCA stent, patent proximal LAD stent, diffuse in-stent restenosis in the mid to distal circumflex, significant inferior wall and basal hypokinesis with estimated EF 35 to 40%, LVEDP 18 mmHg. Follow-up echo revealed LVEF 0000000, grade 1 diastolic dysfunction, normal RV. Discharged 09/13/21. ? ?Today, he is here for post hospital follow-up of CAD, DES to distal RCA.   He reports each time he has been hospitalized he has had weight loss and skin tightness as well as his muscles have softened.  During most recent hospitalization he reports a 10 pound weight loss.  Feeling fatigued, wonders if it is from the weight loss or 2/2 not getting his testosterone injection which he has been getting every 2 weeks for years. He denies chest pain, shortness of breath, lower extremity edema, fatigue, palpitations, melena, hematuria, hemoptysis, diaphoresis, presyncope, syncope, orthopnea, and PND. Chronic chest discomfort described as "sensitive" in middle of chest. Gave me a long history of side effects of medications and intolerances. Tolerating current medical therapy and pleased that he is not having side effects.  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 Also has a weight and equipment station at home that he enjoys using.  Admits eating a high fat, high cholesterol diet heavy in meat, not heart healthy. Does not want to change his diet. Does not want to participate in cardiac rehab.  ? ?Past Medical History:  ?Diagnosis Date  ? Coronary artery disease   ? DES placed in proximal RCA August 2009 and drug eluting stents in the LAD and the OM in February 2011  ? Crohn's disease ([place])   ? Diabetes mellitus, type 2 ([place])   ? GERD (gastroesophageal reflux disease)   ? Hiatal hernia   ? Hypertension   ? ? ?Past Surgical History:  ?Procedure Laterality Date  ? CARPAL TUNNEL RELEASE    ?  CHOLECYSTECTOMY    ? CORONARY/GRAFT ACUTE MI REVASCULARIZATION N/A 09/11/2021  ? Procedure: Coronary/Graft Acute MI Revascularization;  Surgeon: Belva Crome, MD;  Location: [place] [facility];  Service: Cardiovascular;  Laterality: N/A;  ? laporoscopic knee surgery    ? LEFT HEART CATH AND CORONARY ANGIOGRAPHY N/A 09/11/2021  ? Procedure: LEFT HEART CATH AND CORONARY ANGIOGRAPHY;  Surgeon: Belva Crome, MD;  Location: [place] [facility];   Service: Cardiovascular;  Laterality: N/A;  ? ? ?Current Medications: ?Current Meds  ?Medication Sig  ? amLODipine (NORVASC) 10 MG tablet TAKE 1 TABLET BY MOUTH  DAILY  ? aspirin 81 MG EC tablet Take 81 mg by mouth daily.  ? atorvastatin (LIPITOR) 80 MG tablet Take 1 tablet (80 mg total) by mouth daily.  ? clobetasol cream (TEMOVATE) [id number] % Apply 1 application. topically 2 (two) times daily as needed (psorasis).  ? famotidine (PEPCID) 40 MG tablet Take 40 mg by mouth 2 (two) times daily.  ? glimepiride (AMARYL) 1 MG tablet Take 1 tablet by mouth daily.  ? metoprolol succinate (TOPROL-XL) 50 MG 24 hr tablet TAKE 1 AND 1/2 TABLETS BY  MOUTH IN THE MORNING AND AT BEDTIME WITH OR IMMEDIATELY FOLLOWING A MEAL  ? nitroGLYCERIN (NITROSTAT) 0.4 MG SL tablet Place 1 tablet (0.4 mg total) under the tongue every 5 (five) minutes as needed.  ? sodium fluoride (FLUORISHIELD) 1.1 % GEL dental gel Place 1 application. onto teeth at bedtime.  ? Testosterone Cypionate 200 MG/ML SOLN Inject 200 mg as directed every 14 (fourteen) days.  ? ticagrelor (BRILINTA) 90 MG TABS tablet Take 1 tablet (90 mg total) by mouth 2 (two) times daily.  ?  ? ?Allergies:   Balsalazide, Chlorhexidine, Dalbavancin, Losartan, Mesalamine, Omeprazole, Chlorthalidone, Clopidogrel bisulfate, Dicyclomine, Doxycycline, Lactose intolerance (gi), Metformin, Niacin, and Vedolizumab  ? ?Social History  ? ?Socioeconomic History  ? Marital status: Divorced  ?  Spouse name: Not on file  ? Number of children: Not on file  ? Years of education: Not on file  ? Highest education level: Not on file  ?Occupational History  ? Not on file  ?Tobacco Use  ? Smoking status: Former  ? Smokeless tobacco: Current  ?  Types: Chew  ?Vaping Use  ? Vaping Use: Never used  ?Substance and Sexual Activity  ? Alcohol use: No  ? Drug use: No  ? Sexual activity: Not on file  ?Other Topics Concern  ? Not on file  ?Social History Narrative  ? Divorced, 2 children. Smoked 1-2ppd for 20 years but  stopped in 1999.   ? ?Social Determinants of Health  ? ?Financial Resource Strain: Not on file  ?Food Insecurity: Not on file  ?Transportation Needs: Not on file  ?Physical Activity: Not on file  ?Stress: Not on file  ?Social Connections: Not on file  ?  ? ?Family History: ?The patient's family history includes Hypertension in his brother and mother; Stroke in his mother. ? ?ROS:   ?Please see the history of present illness.    ?+ fatigue ?All other systems reviewed and are negative. ? ?Labs/Other Studies Reviewed:   ? ?The following studies were reviewed today: ? ?Echo 09/12/21 ? ?Left Ventricle: Left ventricular ejection fraction, by estimation, is 55  ?to 60%. The left ventricle has normal function. Left ventricular  ?endocardial border not optimally defined to evaluate regional wall motion.  ?The left ventricular internal cavity  ?size was normal in size. There is mild concentric left ventricular  ?hypertrophy. Left  ventricular diastolic parameters are consistent with  ?Grade I diastolic dysfunction (impaired relaxation).  ?Right Ventricle: The right ventricular size is normal. No increase in  ?right ventricular wall thickness. Right ventricular systolic function is  ?normal. Tricuspid regurgitation signal is inadequate for assessing PA  ?pressure.  ?Left Atrium: Left atrial size was normal in size.  ?Right Atrium: Right atrial size was normal in size.  ?Pericardium: There is no evidence of pericardial effusion.  ?Mitral Valve: The mitral valve is grossly normal. No evidence of mitral  ?valve regurgitation. No evidence of mitral valve stenosis.  ?Tricuspid Valve: The tricuspid valve is grossly normal. Tricuspid valve  ?regurgitation is trivial. No evidence of tricuspid stenosis.  ?Aortic Valve: The aortic valve is tricuspid. There is mild calcification  ?of the aortic valve. Aortic valve regurgitation is not visualized. No  ?aortic stenosis is present.  ?Pulmonic Valve: The pulmonic valve was grossly normal.  Pulmonic valve  ?regurgitation is not visualized. No evidence of pulmonic stenosis.  ?Aorta: Aortic dilatation noted. 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  ?Venous: The inferior

## 2021-09-25 ENCOUNTER — Encounter: Payer: Self-pay | Admitting: Nurse Practitioner

## 2021-09-25 ENCOUNTER — Ambulatory Visit (INDEPENDENT_AMBULATORY_CARE_PROVIDER_SITE_OTHER): Payer: MEDICARE | Admitting: Nurse Practitioner

## 2021-09-25 VITALS — BP 138/70 | HR 83 | Ht 67.0 in | Wt 195.0 lb

## 2021-09-25 DIAGNOSIS — R5383 Other fatigue: Secondary | ICD-10-CM | POA: Diagnosis not present

## 2021-09-25 DIAGNOSIS — I10 Essential (primary) hypertension: Secondary | ICD-10-CM

## 2021-09-25 DIAGNOSIS — I5032 Chronic diastolic (congestive) heart failure: Secondary | ICD-10-CM

## 2021-09-25 DIAGNOSIS — I255 Ischemic cardiomyopathy: Secondary | ICD-10-CM

## 2021-09-25 DIAGNOSIS — I779 Disorder of arteries and arterioles, unspecified: Secondary | ICD-10-CM | POA: Diagnosis not present

## 2021-09-25 DIAGNOSIS — I2510 Atherosclerotic heart disease of native coronary artery without angina pectoris: Secondary | ICD-10-CM | POA: Diagnosis not present

## 2021-09-25 DIAGNOSIS — I252 Old myocardial infarction: Secondary | ICD-10-CM

## 2021-09-25 LAB — BASIC METABOLIC PANEL
BUN/Creatinine Ratio: 12 (ref 10–24)
BUN: 14 mg/dL (ref 8–27)
CO2: 24 mmol/L (ref 20–29)
Calcium: 9.3 mg/dL (ref 8.6–10.2)
Chloride: 100 mmol/L (ref 96–106)
Creatinine, Ser: 1.19 mg/dL (ref 0.76–1.27)
Glucose: 175 mg/dL — ABNORMAL HIGH (ref 70–99)
Potassium: 4 mmol/L (ref 3.5–5.2)
Sodium: 135 mmol/L (ref 134–144)
eGFR: 65 mL/min/{1.73_m2} (ref >59)

## 2021-09-25 LAB — CBC
Hematocrit: 35.5 % — ABNORMAL LOW (ref 37.5–51.0)
Hemoglobin: 11.4 g/dL — ABNORMAL LOW (ref 13.0–17.7)
MCH: 25.3 pg — ABNORMAL LOW (ref 26.6–33.0)
MCHC: 32.1 g/dL (ref 31.5–35.7)
MCV: 79 fL (ref 79–97)
Platelets: 471 10*3/uL — ABNORMAL HIGH (ref 150–450)
RBC: 4.5 x10E6/uL (ref 4.14–5.80)
RDW: 13.3 % (ref 11.6–15.4)
WBC: 7.3 10*3/uL (ref 3.4–10.8)

## 2021-09-25 NOTE — Patient Instructions (Signed)
Medication Instructions:  ? ?Your physician recommends that you continue on your current medications as directed. Please refer to the Current Medication list given to you today. ? ? ?*If you need a refill on your cardiac medications before your next appointment, please call your pharmacy* ? ? ?Lab Work: ? ?TODAY!!!!! BMET/CBC ? ?If you have labs (blood work) drawn today and your tests are completely normal, you will receive your results only by: ?MyChart Message (if you have MyChart) OR ?A paper copy in the mail ?If you have any lab test that is abnormal or we need to change your treatment, we will call you to review the results. ? ? ? ?Follow-Up: ?At [facility], you and your health needs are our priority.  As part of our continuing mission to provide you with exceptional heart care, we have created designated Provider Care Teams.  These Care Teams include your primary Cardiologist (physician) and Advanced Practice Providers (APPs -  Physician Assistants and Nurse Practitioners) who all work together to provide you with the care you need, when you need it. ? ?We recommend signing up for the patient portal called "MyChart".  Sign up information is provided on this After Visit Summary.  MyChart is used to connect with patients for Virtual Visits (Telemedicine).  Patients are able to view lab/test results, encounter notes, upcoming appointments, etc.  Non-urgent messages can be sent to your provider as well.   ?To learn more about what you can do with MyChart, go to [email or web address].   ? ?Your next appointment:   ?4 month(s) ? ?The format for your next appointment:   ?In Person ? ?Provider:   ?Lauree Chandler, MD   ? ? ?

## 2021-10-12 ENCOUNTER — Telehealth: Payer: Self-pay

## 2021-10-12 NOTE — Telephone Encounter (Signed)
? ?  Pre-operative Risk Assessment  ?  ?Patient Name: John Cuevas  ?DOB: [birth year] ?MRN: [id number]  ? ?  ? ?Request for Surgical Clearance   ? ?Procedure:   Skyrizi Infusion ? ?Date of Surgery:  Clearance TBD                              ?   ?Surgeon:  Unknown ?Surgeon's Group or Practice Name:  [facility] ?Phone number:  [phone] ?Fax number:  [phone] ?  ?Type of Clearance Requested:   ?- Medical  ?- Pharmacy:  Hold Aspirin and Ticagrelor (Brilinta) Heart attack in March  ?  ?Type of Anesthesia:   None being used ?  ?Additional requests/questions:   No surgeon/pharmacist know but fax came from [facility], [facility] ? ?Signed, ?Jakelin Taussig   ?10/12/2021, 9:52 AM  ? ?

## 2021-10-12 NOTE — Telephone Encounter (Signed)
I s/w [facility]. I informed their office that the pt is not going to be able to be cleared; for reasons the pt had recent MI in March with stents, pt is on ASA and Brilinta. Per Dr. Angelena Form the pt cannot stop these medications listed.  ? ?John Cuevas underwent cardiac catheterization with PCI and stenting on 09/11/2021.  Please contact requesting office and let them know that he will not be eligible to stop his dual antiplatelet therapy until 09/12/2022.  ? ? ?I will fax the notes to fax # listed [phone] ?

## 2021-10-12 NOTE — Telephone Encounter (Signed)
Preoperative team, Georgiana Spinner underwent cardiac catheterization with PCI and stenting on 09/11/2021.  Please contact requesting office and let them know that he will not be eligible to stop his dual antiplatelet therapy until 09/12/2022.  Thank you for your help. ? ?Jossie Ng. Belisa Eichholz NP-C ? ?  ?10/12/2021, 4:07 PM ?[place] ?[address] ?Office [phone] Fax [phone] ? ? ?

## 2021-10-16 ENCOUNTER — Telehealth: Payer: Self-pay | Admitting: Cardiovascular Disease

## 2021-10-16 NOTE — Telephone Encounter (Signed)
I was approached today by Dr. Gibson Ramp nurse in regard to if it was necessary for the ASA and Brilinta to be held for the infusion. Per RN, she s/w the Melissa, Pharm-D who I was told felt the medications in question did not need to be held.  ? ?I assured the RN, that all measures are taken into consideration and being sure to cover all bases. I did state that I will be more than happy to call requesting office back and clarify if truly need to hold Brilinta and ASA. ? ?I have s/w Lanora Manis with [facility]. I explained to her the question that has been brought to me today in regard to the medications in questions. Lanora Manis put me on a brief hold, she s/w the pharmacist and confirmed procedure can be done with the pt on both the ASA and Brilinta. I stated to Lanora Manis that I will represent this to Dr. Clifton James for clearance as the pt does not need to hold the ASA and Brilinta.  ? ?I did state that we want to be sure that we are caring for the pt's appropriately and do not want to delay a procedure for any of our pt's if not necessary. Lanora Manis stated the order has been cancelled and that they will have to get a new order. I stated that will need to come from the ordering provider which was probably GI office. I said if I can get the pt cleared I will be happy to fax over the clearance notes reflecting if the pt has been cleared.   ? ? ? ? ?

## 2021-10-16 NOTE — Telephone Encounter (Signed)
Lengthy conversation with patient re: how poorly he is feeling since his MI last month.  He believes something in his medicines that is triggering to make his Crohns worse.  And the worsening Crohns is causing his muscles to weaken, tighten.  He wonders if it is something that we sterilize our equipment with.  He lost 10 pounds and has worked to put it back on.   The same thing happened in 2011 when he had the same procedure. ? ?He also gets dizzy when he bends over, again he feels this is related to a medication.  I discussed that he needs to hydrate really well especially on days when he has freq loose stools.   ? ?He is working to manage his Crohns but one medication would cost $1800 per month.  There is a clearance request for a Skyrizi infusion but he is not eligible to hold his aspirin and Brilinta until 09/12/22 per pre op APP.  I reviewed this information with the patient.   ? ?I suggested cardiac rehab but he said he walks in the woods about 45 min daily with his dogs and tolerates this well.   ? ?I adv I would forward to Dr. Angelena Form to let him know and will call him if there are any other suggestions.  He is scheduled back for ov in August. ?

## 2021-10-16 NOTE — Telephone Encounter (Signed)
Patient said he has a lot of fatigue after his procedure in March. He said he has to lay down and take a nap almost every day around 1:00 and that is not like him ?He also states that when he gets up in the morning his muscles are very tense almost like they shrink overnight. He says they get better after he gets up and starts walking around  ?

## 2021-10-16 NOTE — Telephone Encounter (Signed)
I s/w both Dr. Clifton James and Eligha Bridegroom, NP in regard to pre op clearance. Please see notes from earlier today. I will fax over ov notes from Eligha Bridegroom, NP; see addendum section at the bottom of the ov notes for clearance. I have left a message for the pt to call the office and ask to s/w Okey Regal with Pre op team, so that I may inform pt ok to proceed with infusion.  ?

## 2021-11-08 ENCOUNTER — Other Ambulatory Visit: Payer: Self-pay | Admitting: Cardiology

## 2021-12-28 ENCOUNTER — Telehealth: Payer: Self-pay | Admitting: Cardiovascular Disease

## 2021-12-28 NOTE — Telephone Encounter (Signed)
Spoke with pt who reports most recent Hgb is 8.8.  Pt states he has a history of ulcers and crohn's disease and sees GI and PCP regularly.  He was advised by GI to contact cardiology due to his concerns re: Brilinta. He believes the Brilinta is contributing to his low Hgb as well, as this is the lowest it has ever been.  He has been referred for an iron infusion but states he has trouble tolerating these d/t GI symptoms.  He complains of profound fatigue and dizziness anytime he bends over.  He states he has not felt well since being discharged from the hospital in March and again contributes this to [place].  Pt denies current CP.   Provided education on anemia.  Pt advised will make Dr Clifton James and pharmacy team aware of his concerns re: Brillinta.  He should plan to keep 01/28/2022 appt with Dr Clifton James and continue his follow up with GI and PCP. Will contact pt with any additional recommendations.  Pt verbalizes understanding and thanked Charity fundraiser for the call.

## 2021-12-28 NOTE — Telephone Encounter (Signed)
  Pt said, his blood count is always low and wont get up. He said, last time it was 8.9 and recently its 8.8. he is concerned and thinking brilinta causing it

## 2021-12-31 NOTE — Telephone Encounter (Signed)
Spoke with pt and advised of Dr Gibson Ramp recommendations below.  Pt agrees to Stop ASA 81mg  but is not willing to try Plavix again.  He states he will continue on Brillinta for now.  Pt thanked for the call.    Charity fundraiser, MD    He is only 4 months out from his last stent. He needs to be on some sort of antiplatelet therapy. With anemia, we can stop his ASA for now and change Brilinta to Plavix which will be less potent. There is documented history of leg pain in the past with Plavix but I doubt the Plavix really caused his leg pain. If he is willing, would stop ASA and Brilinta and start Plavix 75 mg per day. He has chronic fatigue so no other testing needed. Kathleene Hazel

## 2022-01-27 ENCOUNTER — Other Ambulatory Visit: Payer: Self-pay | Admitting: Cardiovascular Disease

## 2022-01-28 ENCOUNTER — Ambulatory Visit (INDEPENDENT_AMBULATORY_CARE_PROVIDER_SITE_OTHER): Payer: MEDICARE | Admitting: Cardiovascular Disease

## 2022-01-28 ENCOUNTER — Encounter: Payer: Self-pay | Admitting: Cardiovascular Disease

## 2022-01-28 VITALS — BP 140/62 | HR 78 | Ht 67.0 in | Wt 190.6 lb

## 2022-01-28 DIAGNOSIS — I10 Essential (primary) hypertension: Secondary | ICD-10-CM

## 2022-01-28 DIAGNOSIS — E7800 Pure hypercholesterolemia, unspecified: Secondary | ICD-10-CM

## 2022-01-28 DIAGNOSIS — I2510 Atherosclerotic heart disease of native coronary artery without angina pectoris: Secondary | ICD-10-CM | POA: Diagnosis not present

## 2022-01-28 DIAGNOSIS — I255 Ischemic cardiomyopathy: Secondary | ICD-10-CM | POA: Diagnosis not present

## 2022-01-28 MED ORDER — ASPIRIN 81 MG PO TBEC: 81.0000 mg | DELAYED_RELEASE_TABLET | Freq: Every day | ORAL | 3 refills | Status: AC

## 2022-01-28 NOTE — Progress Notes (Signed)
Chief Complaint  Patient presents with   Follow-up    CAD    History of Present Illness: 71 yo male with history of CAD, HTN, hyperlipidemia, Crohns disease, DM, hiatal hernia, vertebral artery disease and carotid artery disease  here today for cardiac follow up. He had a drug eluting stent placed in the proximal RCA in 2009 and drug eluting stents placed in the LAD and OM in 2011. Stress myoview January 2022 with no evidence of ischemia.  He has refused to take statins because of joint aches and has not wished to consider alternative therapies. Echo September 2020 with LVEF=60-65%. Moderate LVH. Mild MR. Trace AI. He has had issues with shoulder joint pain and back pain and is being followed at [facility]. He is seeing ortho and pain specialists at [facility]. Abnormal vertebral artery noted on MRI neck at [facility]. He asked me to schedule the neck CTA in February 2019 here to further evaluate. 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 Mild carotid artery disease. He is limited by his Crohn's disease.He was admitted to [facility] in March 2023 with an acute inferior STEMI secondary to occlusion of the distal RCA treated with overlapping drug eluting stents. Diffuse restenosis in the small to moderate caliber Circumflex stented segment. Echo 09/12/21 with LVEF=55-60%, no significant valve disease. He has not tolerated the Lipitor that was started prior to discharge.   He is here today for follow up.  He has many complaints. He feels that the brilinta has worsened his Crohn's disease. He has no energy. The patient denies any chest pain, dyspnea, palpitations, lower extremity edema, orthopnea, PND, dizziness, near syncope or syncope. He wishes to stop the Brilinta. He does not feel well.   Primary Care Physician: Nicola Girt, DO  Past Medical History:  Diagnosis Date   Coronary artery disease     DES placed in proximal RCA August 2009 and drug eluting stents in the LAD and the OM in February 2011. DES x2 distal RCA in 2023.   Crohn's disease ([place])    Diabetes mellitus, type 2 ([place])    GERD (gastroesophageal reflux disease)    Hiatal hernia    Hypertension     Past Surgical History:  Procedure Laterality Date   CARPAL TUNNEL RELEASE     CHOLECYSTECTOMY     CORONARY/GRAFT ACUTE MI REVASCULARIZATION N/A 09/11/2021   Procedure: Coronary/Graft Acute MI Revascularization;  Surgeon: Belva Crome, MD;  Location: [place] [facility];  Service: Cardiovascular;  Laterality: N/A;   laporoscopic knee surgery     LEFT HEART CATH AND CORONARY ANGIOGRAPHY N/A 09/11/2021   Procedure: LEFT HEART CATH AND CORONARY ANGIOGRAPHY;  Surgeon: Belva Crome, MD;  Location: [place] [facility];  Service: Cardiovascular;  Laterality: N/A;    Current Outpatient Medications  Medication Sig Dispense Refill   amLODipine (NORVASC) 10 MG tablet TAKE 1 TABLET BY MOUTH  DAILY 90 tablet 2   aspirin EC 81 MG tablet Take 1 tablet (81 mg total) by mouth daily. Swallow whole. 90 tablet 3   clobetasol cream (TEMOVATE) 6.25 % Apply 1 application. topically 2 (two) times daily as needed (psorasis).     famotidine (PEPCID) 40 MG tablet Take 40 mg by mouth 2 (two) times daily.     glimepiride (AMARYL) 1 MG tablet Take 1 tablet by mouth daily.     metoprolol succinate (TOPROL-XL)  50 MG 24 hr tablet TAKE 1 AND 1/2 TABLETS BY  MOUTH IN THE MORNING AND AT BEDTIME WITH OR IMMEDIATELY FOLLOWING A MEAL 270 tablet 2   nitroGLYCERIN (NITROSTAT) 0.4 MG SL tablet Place 1 tablet (0.4 mg total) under the tongue every 5 (five) minutes as needed. 25 tablet 2   sodium fluoride (FLUORISHIELD) 1.1 % GEL dental gel Place 1 application. onto teeth at bedtime.     Testosterone Cypionate 200 MG/ML SOLN Inject 200 mg as directed every 14 (fourteen) days.     No current facility-administered medications for this visit.    Allergies  Allergen  Reactions   Balsalazide Other (See Comments)    Muscle spasms    Chlorhexidine Rash   Dalbavancin Other (See Comments)    Joint pain and joint stiffness- possible systemic reaction.   Losartan Other (See Comments)    Decreased kidney function Decreased kidney function    Mesalamine Other (See Comments)    Headaches    Omeprazole Other (See Comments)    KNEE PAIN      Chlorthalidone Other (See Comments)    dizzy   Clopidogrel Bisulfate Other (See Comments)    leg pain and fluid on knee   Dicyclomine Other (See Comments)    dizzy/bloated   Doxycycline Other (See Comments)     dizzy   Lactose Intolerance (Gi)    Metformin Other (See Comments)     headache   Niacin Rash   Vedolizumab Other (See Comments)    And joint pain, burning sensation to stomach    Social History   Socioeconomic History   Marital status: Divorced    Spouse name: Not on file   Number of children: Not on file   Years of education: Not on file   Highest education level: Not on file  Occupational History   Not on file  Tobacco Use   Smoking status: Former   Smokeless tobacco: Current    Types: Chew  Vaping Use   Vaping Use: Never used  Substance and Sexual Activity   Alcohol use: No   Drug use: No   Sexual activity: Not on file  Other Topics Concern   Not on file  Social History Narrative   Divorced, 2 children. Smoked 1-2ppd for 20 years but stopped in 1999.    Social Determinants of Health   Financial Resource Strain: Not on file  Food Insecurity: Not on file  Transportation Needs: Not on file  Physical Activity: Not on file  Stress: Not on file  Social Connections: Not on file  Intimate Partner Violence: Not on file    Family History  Problem Relation Age of Onset   Stroke Mother    Hypertension Mother    Hypertension Brother     Review of Systems:  As stated in the HPI and otherwise negative.   BP (!) 140/62   Pulse 78   Ht 5' 7"  (1.702 m)   Wt 190 lb 9.6 oz (86.5 kg)    SpO2 98%   BMI 29.85 kg/m   Physical Examination:  General: Well developed, well nourished, NAD  HEENT: OP clear, mucus membranes moist  SKIN: warm, dry. No rashes. Neuro: No focal deficits  Musculoskeletal: Muscle strength 5/5 all ext  Psychiatric: Mood and affect normal  Neck: No JVD, no carotid bruits, no thyromegaly, no lymphadenopathy.  Lungs:Clear bilaterally, no wheezes, rhonci, crackles Cardiovascular: Regular rate and rhythm. No murmurs, gallops or rubs. Abdomen:Soft. Bowel sounds present. Non-tender.  Extremities: No  lower extremity edema. Pulses are 2 + in the bilateral DP/PT.  EKG:  EKG is not ordered today. The ekg ordered today demonstrates   Echo March 2023:   1. Left ventricular ejection fraction, by estimation, is 55 to 60%. The  left ventricle has normal function. Left ventricular endocardial border  not optimally defined to evaluate regional wall motion. There is mild  concentric left ventricular  hypertrophy. Left ventricular diastolic parameters are consistent with  Grade I diastolic dysfunction (impaired relaxation).   2. Right ventricular systolic function is normal. The right ventricular  size is normal. Tricuspid regurgitation signal is inadequate for assessing  PA pressure.   3. The mitral valve is grossly normal. No evidence of mitral valve  regurgitation. No evidence of mitral stenosis.   4. The aortic valve is tricuspid. There is mild calcification of the  aortic valve. Aortic valve regurgitation is not visualized. No aortic  stenosis is present.   5. Aortic dilatation noted. There is moderate dilatation of the aortic  root, measuring 43 mm.   6. The inferior vena cava is normal in size with greater than 50%  respiratory variability, suggesting right atrial pressure of 3 mmHg.   Recent Labs: 09/11/2021: ALT 27; B Natriuretic Peptide 13.5; TSH 1.625 09/25/2021: BUN 14; Creatinine, Ser 1.19; Hemoglobin 11.4; Platelets 471; Potassium 4.0; Sodium  135   Lipid Panel Lipid Panel     Component Value Date/Time   CHOL 136 09/11/2021 1440   TRIG 43 09/11/2021 1440   HDL 24 (L) 09/11/2021 1440   CHOLHDL 5.7 09/11/2021 1440   VLDL 9 09/11/2021 1440   LDLCALC 103 (H) 09/11/2021 1440   Primary care labs in Epic June 2023    Wt Readings from Last 3 Encounters:  01/28/22 190 lb 9.6 oz (86.5 kg)  09/25/21 195 lb (88.5 kg)  09/11/21 190 lb (86.2 kg)     Other studies Reviewed: Additional studies/ records that were reviewed today include: . Review of the above records demonstrates:    Assessment and Plan:   1. CAD without angina: No chest pain suggestive of angina. He has multi-vessel CAD and has had previous stents placed in the LAD, RCA and OM. Last stent placement in the RCA in March 2023 in the setting of an inferior MI. Continue beta blocker. Will stop Brilinta now as he does not wish to take anymore. He thinks this is making him feel poorly. He refuses Plavix. Will start ASA 81 mg daily. He refuses to take the statin.   2. HYPERTENSION: BP is controlled. No changes.   3. HYPERLIPIDEMIA: He is tolerating Lipitor. He refuses to take Repatha and Praluent.   4. Vertebral artery disease: Noted on MRI of his neck at [facility] in January 2019. He asked me to schedule the neck CTA in February 2019 here to further evaluate. 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 Mild carotid artery disease.   5. Carotid artery disease: Mild carotid disease  Labs/ tests ordered today include:   No orders of the defined types were placed in this encounter.  Disposition:   F/U with me in 6 months  Signed, Lauree Chandler, MD 01/28/2022 9:04 AM    [facility] [place], [place], [place]  [address] Phone: [phone]; Fax: [phone]

## 2022-01-28 NOTE — Patient Instructions (Signed)
Medication Instructions:  Your physician has recommended you make the following change in your medication:  1.) stop Brilinta 2.) start aspirin 81 mg - one tablet daily  *If you need a refill on your cardiac medications before your next appointment, please call your pharmacy*   Lab Work: none   Testing/Procedures: none   Follow-Up: At [facility], you and your health needs are our priority.  As part of our continuing mission to provide you with exceptional heart care, we have created designated Provider Care Teams.  These Care Teams include your primary Cardiologist (physician) and Advanced Practice Providers (APPs -  Physician Assistants and Nurse Practitioners) who all work together to provide you with the care you need, when you need it.  We recommend signing up for the patient portal called "MyChart".  Sign up information is provided on this After Visit Summary.  MyChart is used to connect with patients for Virtual Visits (Telemedicine).  Patients are able to view lab/test results, encounter notes, upcoming appointments, etc.  Non-urgent messages can be sent to your provider as well.   To learn more about what you can do with MyChart, go to [email or web address].    Your next appointment:   6 month(s)  The format for your next appointment:   In Person  Provider:   Lauree Chandler, MD     Important Information About [employer]

## 2022-04-18 ENCOUNTER — Other Ambulatory Visit: Payer: Self-pay | Admitting: Cardiovascular Disease

## 2022-04-24 DEATH — deceased

## 2022-05-22 ENCOUNTER — Ambulatory Visit: Payer: MEDICARE | Admitting: Cardiovascular Disease

## 2022-05-22 NOTE — Progress Notes (Deleted)
Chief Complaint  Patient presents with   Follow-up    CAD    History of Present Illness: 71 yo male with history of CAD, HTN, hyperlipidemia, Crohns disease, DM, hiatal hernia, vertebral artery disease and carotid artery disease  here today for cardiac follow up. He had a drug eluting stent placed in the proximal RCA in 2009 and drug eluting stents placed in the LAD and OM in 2011. Stress myoview January 2022 with no evidence of ischemia.  He has refused to take statins because of joint aches and has not wished to consider alternative therapies. Echo September 2020 with LVEF=60-65%. Moderate LVH. Mild MR. Trace AI. He has had issues with shoulder joint pain and back pain and is being followed at [facility]. He is seeing ortho and pain specialists at [facility]. Abnormal vertebral artery noted on MRI neck at [facility]. He asked me to schedule the neck CTA in February 2019 here to further evaluate. 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 Mild carotid artery disease. He is limited by his Crohn's disease.He was admitted to [facility] in March 2023 with an acute inferior STEMI secondary to occlusion of the distal RCA treated with overlapping drug eluting stents. Diffuse restenosis in the small to moderate caliber Circumflex stented segment. Echo 09/12/21 with LVEF=55-60%, no significant valve disease. He has not tolerated the Lipitor that was started prior to discharge.   He is here today for follow up.  He has many complaints. He feels that the brilinta has worsened his Crohn's disease. He has no energy. The patient denies any chest pain, dyspnea, palpitations, lower extremity edema, orthopnea, PND, dizziness, near syncope or syncope. He wishes to stop the Brilinta. He does not feel well.   Primary Care Physician: Nicola Girt, DO  Past Medical History:  Diagnosis Date   Coronary artery disease     DES placed in proximal RCA August 2009 and drug eluting stents in the LAD and the OM in February 2011. DES x2 distal RCA in 2023.   Crohn's disease ([place])    Diabetes mellitus, type 2 ([place])    GERD (gastroesophageal reflux disease)    Hiatal hernia    Hypertension     Past Surgical History:  Procedure Laterality Date   CARPAL TUNNEL RELEASE     CHOLECYSTECTOMY     CORONARY/GRAFT ACUTE MI REVASCULARIZATION N/A 09/11/2021   Procedure: Coronary/Graft Acute MI Revascularization;  Surgeon: Belva Crome, MD;  Location: [place] [facility];  Service: Cardiovascular;  Laterality: N/A;   laporoscopic knee surgery     LEFT HEART CATH AND CORONARY ANGIOGRAPHY N/A 09/11/2021   Procedure: LEFT HEART CATH AND CORONARY ANGIOGRAPHY;  Surgeon: Belva Crome, MD;  Location: [place] [facility];  Service: Cardiovascular;  Laterality: N/A;    Current Outpatient Medications  Medication Sig Dispense Refill   amLODipine (NORVASC) 10 MG tablet TAKE 1 TABLET BY MOUTH  DAILY 90 tablet 2   aspirin EC 81 MG tablet Take 1 tablet (81 mg total) by mouth daily. Swallow whole. 90 tablet 3   clobetasol cream (TEMOVATE) 5.99 % Apply 1 application. topically 2 (two) times daily as needed (psorasis).     famotidine (PEPCID) 40 MG tablet Take 40 mg by mouth 2 (two) times daily.     glimepiride (AMARYL) 1 MG tablet Take 1 tablet by mouth daily.     metoprolol succinate (TOPROL-XL)  50 MG 24 hr tablet TAKE 1 AND 1/2 TABLETS BY  MOUTH IN THE MORNING AND AT BEDTIME WITH OR IMMEDIATELY FOLLOWING A MEAL 270 tablet 2   nitroGLYCERIN (NITROSTAT) 0.4 MG SL tablet Place 1 tablet (0.4 mg total) under the tongue every 5 (five) minutes as needed. 25 tablet 2   sodium fluoride (FLUORISHIELD) 1.1 % GEL dental gel Place 1 application. onto teeth at bedtime.     Testosterone Cypionate 200 MG/ML SOLN Inject 200 mg as directed every 14 (fourteen) days.     No current facility-administered medications for this visit.    Allergies  Allergen  Reactions   Balsalazide Other (See Comments)    Muscle spasms    Chlorhexidine Rash   Dalbavancin Other (See Comments)    Joint pain and joint stiffness- possible systemic reaction.   Losartan Other (See Comments)    Decreased kidney function Decreased kidney function    Mesalamine Other (See Comments)    Headaches    Omeprazole Other (See Comments)    KNEE PAIN      Chlorthalidone Other (See Comments)    dizzy   Clopidogrel Bisulfate Other (See Comments)    leg pain and fluid on knee   Dicyclomine Other (See Comments)    dizzy/bloated   Doxycycline Other (See Comments)     dizzy   Lactose Intolerance (Gi)    Metformin Other (See Comments)     headache   Niacin Rash   Vedolizumab Other (See Comments)    And joint pain, burning sensation to stomach    Social History   Socioeconomic History   Marital status: Divorced    Spouse name: Not on file   Number of children: Not on file   Years of education: Not on file   Highest education level: Not on file  Occupational History   Not on file  Tobacco Use   Smoking status: Former   Smokeless tobacco: Current    Types: Chew  Vaping Use   Vaping Use: Never used  Substance and Sexual Activity   Alcohol use: No   Drug use: No   Sexual activity: Not on file  Other Topics Concern   Not on file  Social History Narrative   Divorced, 2 children. Smoked 1-2ppd for 20 years but stopped in 1999.    Social Determinants of Health   Financial Resource Strain: Not on file  Food Insecurity: Not on file  Transportation Needs: Not on file  Physical Activity: Not on file  Stress: Not on file  Social Connections: Not on file  Intimate Partner Violence: Not on file    Family History  Problem Relation Age of Onset   Stroke Mother    Hypertension Mother    Hypertension Brother     Review of Systems:  As stated in the HPI and otherwise negative.   BP (!) 140/62   Pulse 78   Ht 5' 7"  (1.702 m)   Wt 190 lb 9.6 oz (86.5 kg)    SpO2 98%   BMI 29.85 kg/m   Physical Examination:  General: Well developed, well nourished, NAD  HEENT: OP clear, mucus membranes moist  SKIN: warm, dry. No rashes. Neuro: No focal deficits  Musculoskeletal: Muscle strength 5/5 all ext  Psychiatric: Mood and affect normal  Neck: No JVD, no carotid bruits, no thyromegaly, no lymphadenopathy.  Lungs:Clear bilaterally, no wheezes, rhonci, crackles Cardiovascular: Regular rate and rhythm. No murmurs, gallops or rubs. Abdomen:Soft. Bowel sounds present. Non-tender.  Extremities: No  lower extremity edema. Pulses are 2 + in the bilateral DP/PT.  EKG:  EKG is not ordered today. The ekg ordered today demonstrates   Echo March 2023:   1. Left ventricular ejection fraction, by estimation, is 55 to 60%. The  left ventricle has normal function. Left ventricular endocardial border  not optimally defined to evaluate regional wall motion. There is mild  concentric left ventricular  hypertrophy. Left ventricular diastolic parameters are consistent with  Grade I diastolic dysfunction (impaired relaxation).   2. Right ventricular systolic function is normal. The right ventricular  size is normal. Tricuspid regurgitation signal is inadequate for assessing  PA pressure.   3. The mitral valve is grossly normal. No evidence of mitral valve  regurgitation. No evidence of mitral stenosis.   4. The aortic valve is tricuspid. There is mild calcification of the  aortic valve. Aortic valve regurgitation is not visualized. No aortic  stenosis is present.   5. Aortic dilatation noted. There is moderate dilatation of the aortic  root, measuring 43 mm.   6. The inferior vena cava is normal in size with greater than 50%  respiratory variability, suggesting right atrial pressure of 3 mmHg.   Recent Labs: 09/11/2021: ALT 27; B Natriuretic Peptide 13.5; TSH 1.625 09/25/2021: BUN 14; Creatinine, Ser 1.19; Hemoglobin 11.4; Platelets 471; Potassium 4.0; Sodium  135   Lipid Panel Lipid Panel     Component Value Date/Time   CHOL 136 09/11/2021 1440   TRIG 43 09/11/2021 1440   HDL 24 (L) 09/11/2021 1440   CHOLHDL 5.7 09/11/2021 1440   VLDL 9 09/11/2021 1440   LDLCALC 103 (H) 09/11/2021 1440   Primary care labs in Epic June 2023    Wt Readings from Last 3 Encounters:  01/28/22 190 lb 9.6 oz (86.5 kg)  09/25/21 195 lb (88.5 kg)  09/11/21 190 lb (86.2 kg)     Other studies Reviewed: Additional studies/ records that were reviewed today include: . Review of the above records demonstrates:    Assessment and Plan:   1. CAD without angina: No chest pain suggestive of angina. He has multi-vessel CAD and has had previous stents placed in the LAD, RCA and OM. Last stent placement in the RCA in March 2023 in the setting of an inferior MI. Continue beta blocker. Will stop Brilinta now as he does not wish to take anymore. He thinks this is making him feel poorly. He refuses Plavix. Will start ASA 81 mg daily. He refuses to take the statin.   2. HYPERTENSION: BP is controlled. No changes.   3. HYPERLIPIDEMIA: He is tolerating Lipitor. He refuses to take Repatha and Praluent.   4. Vertebral artery disease: Noted on MRI of his neck at [facility] in January 2019. He asked me to schedule the neck CTA in February 2019 here to further evaluate. 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 Mild carotid artery disease.   5. Carotid artery disease: Mild carotid disease  Labs/ tests ordered today include:   No orders of the defined types were placed in this encounter.  Disposition:   F/U with me in 6 months  Signed, Lauree Chandler, MD 01/28/2022 9:04 AM    [facility] [place], [place], [place]  [address] Phone: [phone]; Fax: [phone]
